# Patient Record
Sex: Female | Born: 1965 | Race: Black or African American | Hispanic: No | Smoking: Never smoker
Health system: Southern US, Community
[De-identification: ages and names within clinical notes are randomized; demographics above are authoritative.]

## PROBLEM LIST (undated history)

## (undated) DIAGNOSIS — Z8742 Personal history of other diseases of the female genital tract: Secondary | ICD-10-CM

## (undated) DIAGNOSIS — B379 Candidiasis, unspecified: Secondary | ICD-10-CM

## (undated) DIAGNOSIS — A499 Bacterial infection, unspecified: Secondary | ICD-10-CM

## (undated) DIAGNOSIS — Z8619 Personal history of other infectious and parasitic diseases: Secondary | ICD-10-CM

## (undated) DIAGNOSIS — R6882 Decreased libido: Secondary | ICD-10-CM

## (undated) DIAGNOSIS — K219 Gastro-esophageal reflux disease without esophagitis: Secondary | ICD-10-CM

## (undated) HISTORY — DX: Bacterial infection, unspecified: A49.9

## (undated) HISTORY — DX: Decreased libido: R68.82

## (undated) HISTORY — DX: Gastro-esophageal reflux disease without esophagitis: K21.9

## (undated) HISTORY — DX: Personal history of other infectious and parasitic diseases: Z86.19

## (undated) HISTORY — DX: Personal history of other diseases of the female genital tract: Z87.42

## (undated) HISTORY — DX: Candidiasis, unspecified: B37.9

---

## 2002-12-12 ENCOUNTER — Inpatient Hospital Stay (HOSPITAL_COMMUNITY): Admission: AD | Admit: 2002-12-12 | Discharge: 2002-12-12 | Payer: Self-pay | Admitting: *Deleted

## 2010-07-11 DIAGNOSIS — R6882 Decreased libido: Secondary | ICD-10-CM

## 2010-07-11 HISTORY — DX: Decreased libido: R68.82

## 2010-07-21 DIAGNOSIS — Z8742 Personal history of other diseases of the female genital tract: Secondary | ICD-10-CM

## 2010-07-21 HISTORY — DX: Personal history of other diseases of the female genital tract: Z87.42

## 2011-04-16 ENCOUNTER — Ambulatory Visit (INDEPENDENT_AMBULATORY_CARE_PROVIDER_SITE_OTHER): Payer: BC Managed Care – PPO | Admitting: Obstetrics and Gynecology

## 2011-04-16 ENCOUNTER — Encounter: Payer: Self-pay | Admitting: Obstetrics and Gynecology

## 2011-04-16 VITALS — BP 122/80 | Temp 99.0°F | Wt 157.0 lb

## 2011-04-16 DIAGNOSIS — N951 Menopausal and female climacteric states: Secondary | ICD-10-CM

## 2011-04-16 DIAGNOSIS — N760 Acute vaginitis: Secondary | ICD-10-CM

## 2011-04-16 DIAGNOSIS — N898 Other specified noninflammatory disorders of vagina: Secondary | ICD-10-CM

## 2011-04-16 DIAGNOSIS — A499 Bacterial infection, unspecified: Secondary | ICD-10-CM

## 2011-04-16 DIAGNOSIS — B9689 Other specified bacterial agents as the cause of diseases classified elsewhere: Secondary | ICD-10-CM

## 2011-04-16 MED ORDER — METRONIDAZOLE 500 MG PO TABS: 500.0000 mg | ORAL_TABLET | Freq: Two times a day (BID) | ORAL | Status: AC

## 2011-04-16 NOTE — Progress Notes (Signed)
Pt. complains of malodorous vaginal discharge for 1 week. Also stopped BCPs  due to Bp increase but wants something for vasomotor symptoms. Reviewed herbal &  hormonal options.  Patient to consider.  States that she had her thyroid checked 2 months ago and results were normal.  O: EGBUS:  wnl, vagina  normal, cervix-no tenderness or lesions, uterus without tenderness and normal size, adnexae without tenderness or masses   Wet prep  pH 5.0  + whiff  + clue  A:Bacterial Vaginosis     Vasomotor Symptoms    Decreased Libido  P: Metronidazole 500 mg bid x 7 days; Etoh precautions     given  Reviewed causes of decreased libido.  Brochure given on Estrovera (rhubarb root) outlining study comparison with estrogen for vasomotor symptoms   Patient to discuss vasomotor symptoms and decreased libido with Dr. Pennie Rushing at annual exam in July.

## 2011-05-17 ENCOUNTER — Telehealth: Payer: Self-pay | Admitting: Obstetrics and Gynecology

## 2011-05-17 NOTE — Telephone Encounter (Signed)
Tc from pt. Pt c/o vaginal discharge with odor. No irritation. Appt sched 05-18-11@2 :45p with ep for eval. Pt voices understanding.

## 2011-05-17 NOTE — Telephone Encounter (Signed)
chandra/epic 

## 2011-05-18 ENCOUNTER — Ambulatory Visit (INDEPENDENT_AMBULATORY_CARE_PROVIDER_SITE_OTHER): Payer: BC Managed Care – PPO | Admitting: Obstetrics and Gynecology

## 2011-05-18 ENCOUNTER — Encounter: Payer: Self-pay | Admitting: Obstetrics and Gynecology

## 2011-05-18 VITALS — BP 122/80 | HR 82 | Ht 63.0 in | Wt 156.0 lb

## 2011-05-18 DIAGNOSIS — B373 Candidiasis of vulva and vagina: Secondary | ICD-10-CM

## 2011-05-18 DIAGNOSIS — N898 Other specified noninflammatory disorders of vagina: Secondary | ICD-10-CM

## 2011-05-18 LAB — POCT WET PREP (WET MOUNT): Clue Cells Wet Prep Whiff POC: NEGATIVE

## 2011-05-18 MED ORDER — FLUCONAZOLE 150 MG PO TABS: 150.0000 mg | ORAL_TABLET | Freq: Once | ORAL | Status: AC

## 2011-05-18 MED ORDER — METRONIDAZOLE 0.75 % VA GEL: 1.0000 | Freq: Two times a day (BID) | VAGINAL | Status: AC

## 2011-05-18 NOTE — Progress Notes (Signed)
45 YO seen last month for BV returns with symptoms of the same.    O: Pelvic: EGBUS-wnl, vagina-normal rugae- thin white d/c, cervix-no lesions, uterus-normal size, adnexae-no masses  Wet Prep: pH 4.5, whiff-negative, yeast   A; Candida Vaginitis   P: Boric Acid Capsules 600 mg #30 1 pv twice weekly x 4 weeks then prn rf x 1 year  Diflucan 150 mg 1 po stat 1refill  Patient going on a trip to [place] in 2 days

## 2011-05-18 NOTE — Patient Instructions (Signed)
[place] pharmacies that carry Boric Acid Capsules/Suppositories:  [facility] [address] (only)  [phone]  [facility]  [address] ([facility])  [phone]  [facility] ([place])  [address] [phone]  [facility]  [address] [phone]     

## 2011-05-18 NOTE — Progress Notes (Signed)
Odor: yes Fever: no Pelvic Pain: no  Itching: yes Dyspareunia: no Desires GC/CT: no  Thin: yes History of PID: no Desires HIV,RPR,HbsAG: no  Thick: no History of STD: no Other: c/o thin watery d/c

## 2011-05-19 ENCOUNTER — Other Ambulatory Visit: Payer: Self-pay | Admitting: Obstetrics and Gynecology

## 2011-05-19 NOTE — Telephone Encounter (Signed)
Spoke with pharmacist at [facility] rgd instruction for metrogel advised one applicator full at [facility] for five days

## 2011-05-19 NOTE — Telephone Encounter (Signed)
Triage/epic 

## 2011-08-18 ENCOUNTER — Encounter: Payer: BC Managed Care – PPO | Admitting: Obstetrics and Gynecology

## 2011-09-08 ENCOUNTER — Ambulatory Visit: Payer: BC Managed Care – PPO | Admitting: Obstetrics and Gynecology

## 2012-01-31 ENCOUNTER — Encounter: Payer: Self-pay | Admitting: Obstetrics and Gynecology

## 2012-01-31 ENCOUNTER — Ambulatory Visit: Payer: BC Managed Care – PPO | Admitting: Obstetrics and Gynecology

## 2012-01-31 VITALS — BP 120/78 | Temp 98.7°F | Wt 160.0 lb

## 2012-01-31 DIAGNOSIS — N390 Urinary tract infection, site not specified: Secondary | ICD-10-CM

## 2012-01-31 DIAGNOSIS — R309 Painful micturition, unspecified: Secondary | ICD-10-CM

## 2012-01-31 DIAGNOSIS — N912 Amenorrhea, unspecified: Secondary | ICD-10-CM

## 2012-01-31 LAB — POCT URINALYSIS DIPSTICK
Bilirubin, UA: NEGATIVE
Glucose, UA: NEGATIVE
Ketones, UA: NEGATIVE
Spec Grav, UA: <=1.005
pH, UA: 7

## 2012-01-31 MED ORDER — FLUCONAZOLE 150 MG PO TABS: 150.0000 mg | ORAL_TABLET | Freq: Once | ORAL | Status: DC

## 2012-01-31 MED ORDER — CIPROFLOXACIN HCL 500 MG PO TABS: 500.0000 mg | ORAL_TABLET | Freq: Two times a day (BID) | ORAL | Status: DC

## 2012-01-31 NOTE — Progress Notes (Signed)
47 YO with skipped period in January and has not had any molimina and noticed after period in December, had hot flashes that lasted about 3 weeks and request for uti evaluation due to dysuria, malodorous urine and "bladder" crampiness. 1 week ago.  O: U/A- SG: 1.005, pH-7,  1+ leuk/blood      Abdomen: soft without tenderness or guarding      Pelvic: EGBUS-wnl, vagina-normal, cervix/uterus-without tenderness, adnexae-no masses or tenderness  A: Oligomenorrhea     UTI  P: Cipro 500 mg  # 6 bid x 3 days     Diflucan 150 mg #1 1 po stat 1 refill prn      Reviewed menopause definition and possible comfort measures (medical, herbal & misc.)     Patient to observe for now, may try to locate [employer] for comfort      Planning trip to [place] in spring may consider something if needed for vasomotor symptoms      Keep menstrual calendar      Henreitta Leber, PA-C

## 2012-01-31 NOTE — Patient Instructions (Signed)
[employer]  (may be available)  [facility]: [employer] [facility] [employer]

## 2012-02-01 ENCOUNTER — Ambulatory Visit (INDEPENDENT_AMBULATORY_CARE_PROVIDER_SITE_OTHER): Payer: BC Managed Care – PPO | Admitting: Family Medicine

## 2012-02-01 VITALS — BP 128/82 | HR 91 | Temp 99.0°F | Resp 17 | Ht 63.5 in | Wt 156.0 lb

## 2012-02-01 DIAGNOSIS — T148XXA Other injury of unspecified body region, initial encounter: Secondary | ICD-10-CM

## 2012-02-01 DIAGNOSIS — M79609 Pain in unspecified limb: Secondary | ICD-10-CM

## 2012-02-01 DIAGNOSIS — Z23 Encounter for immunization: Secondary | ICD-10-CM

## 2012-02-01 MED ORDER — HYDROCODONE-ACETAMINOPHEN 5-325 MG PO TABS: 1.0000 | ORAL_TABLET | Freq: Four times a day (QID) | ORAL | Status: DC | PRN

## 2012-02-01 NOTE — Progress Notes (Signed)
Patient ID: LAFAYE MCELMURRY MRN: [id number], DOB: [birth year], 47 y.o. Date of Encounter: 02/01/2012, 1:36 PM   PROCEDURE NOTE: Verbal consent obtained. Sterile technique employed. Numbing: Anesthesia obtained with 2% lidocaine plain.  Cleansed with soap and water. Irrigated.  Wound explored, no deep structures involved, no foreign bodies.   Wound repaired with # 3 HM and #2 SI sutures. Hemostasis obtained. Wound cleansed and dressed.  Wound care instructions including precautions covered with patient. Handout given.  Anticipate suture removal in 10 days.   Rhoderick Moody, PA-C 02/01/2012 1:36 PM

## 2012-02-01 NOTE — Progress Notes (Signed)
This 47 year old woman who was injured while cleaning her blender. The blade fell out and lacerated her right dorsal ring finger at the PIP joint. It bled for a while but has stopped at this point. She's unsure of her last technical shot.  Objective: No acute distress Examination of the right ring finger reveals full range of motion with good strength on extension. There is approximately 1.2 cm laceration diagonally across the PIP joint dorsally. There is a small superficial diagonal laceration to the extensor tendon.  Sensation is normal. There is no significant swelling and bleeding stopped.    assessment: Simple laceration right dorsal ring finger at the PIP joint  Plan: Tdap and wound repair per PA

## 2012-02-01 NOTE — Patient Instructions (Addendum)

## 2012-02-02 LAB — URINE CULTURE

## 2012-02-11 ENCOUNTER — Ambulatory Visit (INDEPENDENT_AMBULATORY_CARE_PROVIDER_SITE_OTHER): Payer: BC Managed Care – PPO | Admitting: Family Medicine

## 2012-02-11 VITALS — BP 129/84 | HR 87 | Temp 98.3°F | Resp 16 | Ht 63.5 in | Wt 159.0 lb

## 2012-02-11 DIAGNOSIS — Z4802 Encounter for removal of sutures: Secondary | ICD-10-CM

## 2012-02-11 NOTE — Progress Notes (Signed)
Subjective: Patient sutures 9 days ago. She is here for suture removal  Objective wound on finger appears well-healed. Sutures removed without difficulty.  Assessment: Wound finger, resolving:  Plan:  Return when necessary. Keep a Band-Aid for a few days.

## 2013-06-19 DIAGNOSIS — Z975 Presence of (intrauterine) contraceptive device: Secondary | ICD-10-CM | POA: Insufficient documentation

## 2015-09-10 DIAGNOSIS — B009 Herpesviral infection, unspecified: Secondary | ICD-10-CM | POA: Insufficient documentation

## 2015-10-24 DIAGNOSIS — R09A2 Foreign body sensation, throat: Secondary | ICD-10-CM | POA: Insufficient documentation

## 2015-10-24 DIAGNOSIS — K219 Gastro-esophageal reflux disease without esophagitis: Secondary | ICD-10-CM | POA: Insufficient documentation

## 2015-10-24 DIAGNOSIS — R0989 Other specified symptoms and signs involving the circulatory and respiratory systems: Secondary | ICD-10-CM | POA: Insufficient documentation

## 2015-10-29 ENCOUNTER — Other Ambulatory Visit (HOSPITAL_COMMUNITY): Payer: Self-pay | Admitting: Otolaryngology

## 2015-10-29 DIAGNOSIS — R1310 Dysphagia, unspecified: Secondary | ICD-10-CM

## 2015-11-12 ENCOUNTER — Ambulatory Visit (HOSPITAL_COMMUNITY)
Admission: RE | Admit: 2015-11-12 | Discharge: 2015-11-12 | Disposition: A | Discharge status: Home / Self Care | Payer: 59 | Source: Ambulatory Visit | Attending: Otolaryngology | Admitting: Otolaryngology

## 2015-11-12 DIAGNOSIS — R1310 Dysphagia, unspecified: Secondary | ICD-10-CM

## 2018-03-16 ENCOUNTER — Encounter: Payer: Self-pay | Admitting: Family Medicine

## 2018-03-16 ENCOUNTER — Ambulatory Visit (INDEPENDENT_AMBULATORY_CARE_PROVIDER_SITE_OTHER): Payer: Managed Care, Other (non HMO) | Admitting: Family Medicine

## 2018-03-16 ENCOUNTER — Other Ambulatory Visit: Payer: Self-pay

## 2018-03-16 VITALS — BP 140/90 | HR 97 | Temp 98.3°F | Ht 63.0 in | Wt 160.2 lb

## 2018-03-16 DIAGNOSIS — Z1211 Encounter for screening for malignant neoplasm of colon: Secondary | ICD-10-CM

## 2018-03-16 DIAGNOSIS — Z0000 Encounter for general adult medical examination without abnormal findings: Secondary | ICD-10-CM | POA: Diagnosis not present

## 2018-03-16 DIAGNOSIS — K219 Gastro-esophageal reflux disease without esophagitis: Secondary | ICD-10-CM | POA: Diagnosis not present

## 2018-03-16 NOTE — Progress Notes (Signed)
Stephanie Cherry is a 53 y.o. female  Chief Complaint  Patient presents with  . Establish Care    establish care/Chronic cough years-- may be connected to acid reflux? / scheduling colonoscopy, Jan 2020 ap, oct 2019 mammo    HPI: Stephanie Cherry is a 53 y.o. female here to establish care with our office.  She is also here for annual CPE. She is not fasting for labs and will RTO for fasting lab appt.  Her biggest issue/concern is cough, globus sensation, throat clearing. Symptoms worse after eating, at night, and first thing in AM. She has seen ENT Dr. Wilburn Cornelia and was Rx'd omeprazole 59m daily. She had barium swallow about 2 years that was normal. She was referred for colonoscopy to GI Dr. MCollene Maresbut pt would like referral to be seen in office to discuss reflux  Last PAP: 01/2018 Last mammo: 10/2017 Last colonoscopy: never   Past Medical History:  Diagnosis Date  . Bacterial infection   . H/O chlamydia infection   . H/O: menorrhagia 07/21/10  . Libido, decreased 07/11/2010  . Yeast infection     No past surgical history on file.  Social History   Socioeconomic History  . Marital status: Divorced    Spouse name: Not on file  . Number of children: Not on file  . Years of education: Not on file  . Highest education level: Not on file  Occupational History  . Not on file  Social Needs  . Financial resource strain: Not on file  . Food insecurity:    Worry: Not on file    Inability: Not on file  . Transportation needs:    Medical: Not on file    Non-medical: Not on file  Tobacco Use  . Smoking status: Never Smoker  . Smokeless tobacco: Never Used  Substance and Sexual Activity  . Alcohol use: Yes    Comment: social  . Drug use: No  . Sexual activity: Yes    Birth control/protection: Condom  Lifestyle  . Physical activity:    Days per week: Not on file    Minutes per session: Not on file  . Stress: Not on file  Relationships  . Social connections:    Talks on  phone: Not on file    Gets together: Not on file    Attends religious service: Not on file    Active member of club or organization: Not on file    Attends meetings of clubs or organizations: Not on file    Relationship status: Not on file  . Intimate partner violence:    Fear of current or ex partner: Not on file    Emotionally abused: Not on file    Physically abused: Not on file    Forced sexual activity: Not on file  Other Topics Concern  . Not on file  Social History Narrative  . Not on file    Family History  Problem Relation Age of Onset  . Hypertension Father   . Hypertension Mother      Immunization History  Administered Date(s) Administered  . Influenza Inj Mdck Quad Pf 09/29/2017  . MMR 05/31/2007  . Td 05/31/2007  . Tdap 05/31/2007    Outpatient Encounter Medications as of 03/16/2018  Medication Sig  . Cholecalciferol (VITAMIN D-1000 MAX ST) 25 MCG (1000 UT) tablet Take by mouth.  . estradiol (VIVELLE-DOT) 0.05 MG/24HR patch APPLY 1 PATCH TWICE WEEKLY AS DIRECTED  . omeprazole (PRILOSEC) 40 MG capsule Take  by mouth.  . [DISCONTINUED] Ascorbic Acid (VITAMIN C) 100 MG tablet Take 100 mg by mouth daily.  . [DISCONTINUED] ciprofloxacin (CIPRO) 500 MG tablet Take 1 tablet (500 mg total) by mouth 2 (two) times daily.  . [DISCONTINUED] Evening Primrose topical oil Apply topically as needed.  . [DISCONTINUED] fish oil-omega-3 fatty acids 1000 MG capsule Take 2 g by mouth daily.   . [DISCONTINUED] fluconazole (DIFLUCAN) 150 MG tablet Take 1 tablet (150 mg total) by mouth once.  . [DISCONTINUED] HYDROcodone-acetaminophen (NORCO/VICODIN) 5-325 MG per tablet Take 1 tablet by mouth every 6 (six) hours as needed for pain.  . [DISCONTINUED] Multiple Vitamin (MULTIVITAMIN) tablet Take 1 tablet by mouth daily.   No facility-administered encounter medications on file as of 03/16/2018.      ROS: Gen: no fever, chills  Skin: no rash, itching ENT: no ear pain, ear drainage,  nasal congestion, rhinorrhea, sinus pressure, sore throat Eyes: no blurry vision, double vision Resp: no cough, wheeze,SOB CV: no CP, palpitations, LE edema,  GI: + heartburn, n/v/d/c, abd pain GU: no dysuria, urgency, frequency, hematuria  MSK: no joint pain, myalgias, back pain Neuro: no dizziness, headache, weakness, vertigo Psych: no depression, anxiety, insomnia   Allergies  Allergen Reactions  . Sulfa Antibiotics Hives    BP 140/90   Pulse 97   Temp 98.3 F (36.8 C) (Oral)   Ht 5' 3" (1.6 m)   Wt 160 lb 3.2 oz (72.7 kg)   SpO2 98%   BMI 28.38 kg/m   BP Readings from Last 3 Encounters:  03/16/18 140/90  02/11/12 129/84  02/01/12 128/82     Physical Exam  Constitutional: She is oriented to person, place, and time. She appears well-developed and well-nourished. No distress.  HENT:  Head: Normocephalic and atraumatic.  Right Ear: Tympanic membrane and ear canal normal.  Left Ear: Tympanic membrane and ear canal normal.  Nose: Nose normal.  Mouth/Throat: Oropharynx is clear and moist and mucous membranes are normal.  Neck: Neck supple. No thyromegaly present.  Cardiovascular: Normal rate, regular rhythm and normal heart sounds.  Pulmonary/Chest: Effort normal and breath sounds normal. No respiratory distress. She has no wheezes. She has no rhonchi.  Abdominal: Soft. Bowel sounds are normal. She exhibits no distension and no mass. There is no abdominal tenderness.  Musculoskeletal: Normal range of motion.        General: No edema.  Lymphadenopathy:    She has no cervical adenopathy.  Neurological: She is alert and oriented to person, place, and time.  Skin: Skin is warm and dry.  Psychiatric: She has a normal mood and affect. Her behavior is normal.     A/P:  1. Screening for colon cancer - Ambulatory referral to Gastroenterology  2. Gastroesophageal reflux disease, esophagitis presence not specified - symptoms x years - minimal relief with omeprazole 90m,  diet modification - normal barium swallow 2 years ago (ordered by ENT) - Ambulatory referral to Gastroenterology  3. Annual physical exam - UTD on dental exam, pt plans to schedule vision exam - discussed importance of regular CV exercise, healthy diet, adequate sleep - UTD on PAP, mammo; referral for colonoscopy placed - ALT - AST - Basic metabolic panel - VITAMIN D 25 Hydroxy (Vit-D Deficiency, Fractures) - Lipid panel - Vitamin B12 - next CPE in 1 year

## 2018-03-17 ENCOUNTER — Other Ambulatory Visit: Payer: Self-pay

## 2018-03-17 ENCOUNTER — Other Ambulatory Visit (INDEPENDENT_AMBULATORY_CARE_PROVIDER_SITE_OTHER): Payer: Managed Care, Other (non HMO)

## 2018-03-17 DIAGNOSIS — Z01419 Encounter for gynecological examination (general) (routine) without abnormal findings: Secondary | ICD-10-CM

## 2018-03-17 LAB — BASIC METABOLIC PANEL
BUN: 15 mg/dL (ref 6–23)
CHLORIDE: 102 meq/L (ref 96–112)
CO2: 31 mEq/L (ref 19–32)
Calcium: 9.6 mg/dL (ref 8.4–10.5)
Creatinine, Ser: 0.91 mg/dL (ref 0.40–1.20)
GFR: 78.3 mL/min (ref >60.00)
Glucose, Bld: 90 mg/dL (ref 70–99)
POTASSIUM: 4.3 meq/L (ref 3.5–5.1)
Sodium: 141 mEq/L (ref 135–145)

## 2018-03-17 LAB — LIPID PANEL
Cholesterol: 217 mg/dL — ABNORMAL HIGH (ref 0–200)
HDL: 69.6 mg/dL (ref >39.00)
LDL Cholesterol: 130 mg/dL — ABNORMAL HIGH (ref 0–99)
NonHDL: 147.17
TRIGLYCERIDES: 86 mg/dL (ref 0.0–149.0)
Total CHOL/HDL Ratio: 3
VLDL: 17.2 mg/dL (ref 0.0–40.0)

## 2018-03-17 LAB — VITAMIN D 25 HYDROXY (VIT D DEFICIENCY, FRACTURES): VITD: 30.22 ng/mL (ref 30.00–100.00)

## 2018-03-17 LAB — AST: AST: 19 U/L (ref 0–37)

## 2018-03-17 LAB — ALT: ALT: 13 U/L (ref 0–35)

## 2018-03-17 LAB — VITAMIN B12: Vitamin B-12: 805 pg/mL (ref 211–911)

## 2018-03-17 NOTE — Progress Notes (Signed)
Labs drawn from 3.12.20 visit with [facility]/thx dmf

## 2018-03-27 HISTORY — PX: COLONOSCOPY: SHX174

## 2018-03-27 HISTORY — PX: ESOPHAGOGASTRODUODENOSCOPY: SHX1529

## 2018-06-19 ENCOUNTER — Encounter: Payer: Self-pay | Admitting: Family Medicine

## 2019-03-22 ENCOUNTER — Encounter: Payer: Self-pay | Admitting: Family Medicine

## 2019-03-22 ENCOUNTER — Telehealth (INDEPENDENT_AMBULATORY_CARE_PROVIDER_SITE_OTHER): Payer: BLUE CROSS/BLUE SHIELD | Admitting: Family Medicine

## 2019-03-22 ENCOUNTER — Encounter: Payer: Self-pay | Admitting: Gastroenterology

## 2019-03-22 VITALS — Ht 63.0 in

## 2019-03-22 DIAGNOSIS — R5383 Other fatigue: Secondary | ICD-10-CM | POA: Diagnosis not present

## 2019-03-22 DIAGNOSIS — Z0000 Encounter for general adult medical examination without abnormal findings: Secondary | ICD-10-CM

## 2019-03-22 DIAGNOSIS — K219 Gastro-esophageal reflux disease without esophagitis: Secondary | ICD-10-CM | POA: Diagnosis not present

## 2019-03-22 DIAGNOSIS — R1030 Lower abdominal pain, unspecified: Secondary | ICD-10-CM

## 2019-03-22 NOTE — Progress Notes (Signed)
Virtual Visit via Video Note  I connected with SHARNEE Cherry on 03/22/19 at 11:30 AM EDT by a video enabled telemedicine application and verified that I am speaking with the correct person using two identifiers. Location patient: home Location provider: home office Persons participating in the virtual visit: patient, provider  I discussed the limitations of evaluation and management by telemedicine and the availability of in person appointments. The patient expressed understanding and agreed to proceed.  Chief Complaint  Patient presents with  . Allergies    Pt c/o sypmtoms of a gluten allergy.  Pt said that after she eats, ther is abd pain and diarrhea.  Pt explains that this happens intermittently but more frequent x 39months or so.  Pt has concerns of a sensitvity to gluten    HPI: Stephanie Cherry is a 54 y.o. female who complains of episodes of abdominal pain and diarrhea after eating. She notes 5 episodes in the past 6 mo. She cannot relate these episodes to what she ate. Otherwise she has 1 BM daily in AM. No blood in stool. No unexplained weight loss. No fam h/o IBD, celiac. She wonders/is worries about a gluten allergy.  She complains of globus sensation, bloating, flatus and belching - on/off x years - more frequent in the past 6 mo.  She notes a decreased appetite which is not new and mainly because she "knows what is to come".  She colonoscopy and endoscopy done 1 year ago with Dr. Collene Mares.  She has LPR, has been on/off omeprazole. She follows with ENT Dr[facility] Jerrell [facility]) - records available in [place].  She is on omeprazole 40mg  BID since 10/2018 but is now "cycling off". She does not want to be on meds long term, especially if she does not feel they are helping.   She is due for CPE, labs. She would like to come in tomorrow AM for fasting lab appt.   Past Medical History:  Diagnosis Date  . Bacterial infection   . H/O chlamydia infection   . H/O:  menorrhagia 07/21/10  . Libido, decreased 07/11/2010  . Yeast infection     History reviewed. No pertinent surgical history.  Family History  Problem Relation Age of Onset  . Hypertension Father   . Arthritis Father   . Hypertension Mother   . Arthritis Mother   . Cancer Mother   . Hyperlipidemia Mother   . Arthritis Brother   . Hyperlipidemia Brother   . Hypertension Brother   . Arthritis Maternal Grandmother   . Cancer Maternal Grandfather     Social History   Tobacco Use  . Smoking status: Never Smoker  . Smokeless tobacco: Never Used  Substance Use Topics  . Alcohol use: Yes    Comment: social  . Drug use: No     Current Outpatient Medications:  .  Cholecalciferol (VITAMIN D-1000 MAX ST) 25 MCG (1000 UT) tablet, Take by mouth., Disp: , Rfl:  .  estradiol (VIVELLE-DOT) 0.05 MG/24HR patch, APPLY 1 PATCH TWICE WEEKLY AS DIRECTED, Disp: , Rfl:  .  fluticasone (FLONASE) 50 MCG/ACT nasal spray, Place into the nose., Disp: , Rfl:  .  HYDROcodone-acetaminophen (NORCO/VICODIN) 5-325 MG tablet, hydrocodone 5 mg-acetaminophen 325 mg tablet, Disp: , Rfl:  .  ipratropium (ATROVENT) 0.06 % nasal spray, Place 2 sprays into both nostrils 2 (two) times daily., Disp: , Rfl:  .  levonorgestrel (MIRENA, 52 MG,) 20 MCG/24HR IUD, Mirena 20 mcg/24 hours (6 yrs) 52 mg intrauterine  device, Disp: , Rfl:  .  omeprazole (PRILOSEC) 40 MG capsule, omeprazole 40 mg capsule,delayed release, Disp: , Rfl:  .  omeprazole (PRILOSEC) 40 MG capsule, Take by mouth., Disp: , Rfl:   Allergies  Allergen Reactions  . Sulfa Antibiotics Hives      ROS: See pertinent positives and negatives per HPI.   EXAM:  VITALS per patient if applicable: Ht 5\' 3"  (1.6 m)   BMI 28.38 kg/m    GENERAL: alert, oriented, appears well and in no acute distress  HEENT: atraumatic, conjunctiva clear, no obvious abnormalities on inspection of external nose and ears  NECK: normal movements of the head and neck  LUNGS:  on inspection no signs of respiratory distress, breathing rate appears normal, no obvious gross SOB, gasping or wheezing, no conversational dyspnea  CV: no obvious cyanosis  PSYCH/NEURO: pleasant and cooperative, speech and thought processing grossly intact   ASSESSMENT AND PLAN:  1. Laryngopharyngeal reflux (LPR) 2. Gastroesophageal reflux disease, unspecified whether esophagitis present - symptoms x years - minimal improvement with PPIs, stil with daily symptoms - endoscopy done with Dr. Collene Mares in 03/2018 - pt would be interested in seeing [facility] Dr. Gerrit Heck to discuss symptoms and TIF procedure - Ambulatory referral to Gastroenterology  3. Annual physical exam - pt to schedule - CBC; Future - VITAMIN D 25 Hydroxy (Vit-D Deficiency, Fractures); Future - Vitamin B12; Future - Lipid panel; Future - Basic metabolic panel; Future - AST; Future - ALT; Future  4. Intermittent lower abdominal pain - Ambulatory referral to Gastroenterology  5. Fatigue, unspecified type - CBC; Future - Vitamin B12; Future   I discussed the assessment and treatment plan with the patient. The patient was provided an opportunity to ask questions and all were answered. The patient agreed with the plan and demonstrated an understanding of the instructions.   The patient was advised to call back or seek an in-person evaluation if the symptoms worsen or if the condition fails to improve as anticipated.   [name], DO

## 2019-03-23 ENCOUNTER — Other Ambulatory Visit (INDEPENDENT_AMBULATORY_CARE_PROVIDER_SITE_OTHER): Payer: BLUE CROSS/BLUE SHIELD

## 2019-03-23 ENCOUNTER — Other Ambulatory Visit: Payer: Self-pay

## 2019-03-23 DIAGNOSIS — R5383 Other fatigue: Secondary | ICD-10-CM

## 2019-03-23 DIAGNOSIS — Z0000 Encounter for general adult medical examination without abnormal findings: Secondary | ICD-10-CM | POA: Diagnosis not present

## 2019-03-23 LAB — LIPID PANEL
Cholesterol: 197 mg/dL (ref 0–200)
HDL: 57.3 mg/dL (ref >39.00)
LDL Cholesterol: 127 mg/dL — ABNORMAL HIGH (ref 0–99)
NonHDL: 139.23
Total CHOL/HDL Ratio: 3
Triglycerides: 62 mg/dL (ref 0.0–149.0)
VLDL: 12.4 mg/dL (ref 0.0–40.0)

## 2019-03-23 LAB — AST: AST: 18 U/L (ref 0–37)

## 2019-03-23 LAB — CBC
HCT: 42.4 % (ref 36.0–46.0)
Hemoglobin: 14.6 g/dL (ref 12.0–15.0)
MCHC: 34.4 g/dL (ref 30.0–36.0)
MCV: 100.6 fl — ABNORMAL HIGH (ref 78.0–100.0)
Platelets: 275 10*3/uL (ref 150.0–400.0)
RBC: 4.21 Mil/uL (ref 3.87–5.11)
RDW: 12.1 % (ref 11.5–15.5)
WBC: 5.5 10*3/uL (ref 4.0–10.5)

## 2019-03-23 LAB — VITAMIN D 25 HYDROXY (VIT D DEFICIENCY, FRACTURES): VITD: 36.91 ng/mL (ref 30.00–100.00)

## 2019-03-23 LAB — BASIC METABOLIC PANEL
BUN: 12 mg/dL (ref 6–23)
CO2: 32 mEq/L (ref 19–32)
Calcium: 9.7 mg/dL (ref 8.4–10.5)
Chloride: 102 mEq/L (ref 96–112)
Creatinine, Ser: 1.1 mg/dL (ref 0.40–1.20)
GFR: 62.67 mL/min (ref >60.00)
Glucose, Bld: 91 mg/dL (ref 70–99)
Potassium: 4.1 mEq/L (ref 3.5–5.1)
Sodium: 141 mEq/L (ref 135–145)

## 2019-03-23 LAB — VITAMIN B12: Vitamin B-12: 1065 pg/mL — ABNORMAL HIGH (ref 211–911)

## 2019-03-23 LAB — ALT: ALT: 14 U/L (ref 0–35)

## 2019-03-29 ENCOUNTER — Encounter: Payer: Self-pay | Admitting: Family Medicine

## 2019-04-24 ENCOUNTER — Ambulatory Visit: Payer: BLUE CROSS/BLUE SHIELD | Admitting: Gastroenterology

## 2019-04-24 ENCOUNTER — Encounter: Payer: Self-pay | Admitting: Gastroenterology

## 2019-04-24 ENCOUNTER — Other Ambulatory Visit: Payer: Self-pay

## 2019-04-24 VITALS — BP 132/86 | HR 96 | Temp 97.5°F | Ht 63.0 in | Wt 157.5 lb

## 2019-04-24 DIAGNOSIS — K219 Gastro-esophageal reflux disease without esophagitis: Secondary | ICD-10-CM

## 2019-04-24 DIAGNOSIS — R198 Other specified symptoms and signs involving the digestive system and abdomen: Secondary | ICD-10-CM

## 2019-04-24 DIAGNOSIS — R05 Cough: Secondary | ICD-10-CM | POA: Diagnosis not present

## 2019-04-24 DIAGNOSIS — R490 Dysphonia: Secondary | ICD-10-CM

## 2019-04-24 DIAGNOSIS — R0989 Other specified symptoms and signs involving the circulatory and respiratory systems: Secondary | ICD-10-CM

## 2019-04-24 DIAGNOSIS — R053 Chronic cough: Secondary | ICD-10-CM

## 2019-04-24 NOTE — Progress Notes (Signed)
Chief Complaint: GERD, LPR, chronic cough  Referring Provider:     Ronnald Nian, DO   HPI:    Stephanie Cherry is a 54 y.o. female referred to the Gastroenterology Clinic for evaluation of reflux symptoms, chronic cough, LPR.  Index reflux sxs of chronic cough, increased throat clearing, raspy voice, hoarseness, globus sensation. Sxs can occur throughout the day, but worse at night, with choking sensation. Not related to PO intake or food types. Sleeps with HOB elevated. No dysphagia, odynophagia. Avoids eating within 3 hours of bedtime. Sxs present since at least 2013, and progressively worsening. No HB or regurgitation.   Separately, c/o globus sensation, abdominal bloating, increased flatus and belching.  Symptoms have been intermittent for a number of years, but more frequent over the last 6 months or so.  Colonoscopy and EGD laryngopharyngeal completed by Dr. Collene Mares 03/2018 as below.  Previously diagnosed with LPR, has been on/off omeprazole.  Follows with [facility].  Last seen in 10/2018, and per notes, history and exam most consistent with LPR with plan for 89-month trial of omeprazole 40 mg  BID daily x4 months.  Only minimal improvement, and has titrated off approx 3 weeks ago.   Evaluated in the Allergy Clinic in 10/2018, and I again told she had reflux induced symptoms.  She is interested in antireflux surgical options as a means to better control her symptoms and potentially stop her significant reduce the need for acid suppression therapy.  -Barium swallow (11/2015): Normal -EGD (03/2018, Dr. Collene Mares): We will request full report.  Patient currently reviewed today and seemingly normal -Laryngoscopy (10/2018, Dr. Constance Holster): Moderate post glottic edema, otherwise normal.  Findings consistent with LPR.  -Colonoscopy (3/20, Dr. Collene Mares): We will request.  Patient copy reviewed and images otherwise.  Patient recalls 10-year recall   Past Medical History:  Diagnosis  Date  . Bacterial infection   . GERD (gastroesophageal reflux disease)   . H/O chlamydia infection   . H/O: menorrhagia 07/21/10  . Libido, decreased 07/11/2010  . Yeast infection      Past Surgical History:  Procedure Laterality Date  . COLONOSCOPY    . ESOPHAGOGASTRODUODENOSCOPY     Family History  Problem Relation Age of Onset  . Hypertension Father   . Arthritis Father   . Hypertension Mother   . Arthritis Mother   . Hyperlipidemia Mother   . Lung cancer Mother   . Arthritis Brother   . Hyperlipidemia Brother   . Hypertension Brother   . Arthritis Maternal Grandmother   . Cancer Maternal Grandfather   . Colon cancer Neg Hx   . Esophageal cancer Neg Hx    Social History   Tobacco Use  . Smoking status: Never Smoker  . Smokeless tobacco: Never Used  Substance Use Topics  . Alcohol use: Yes    Comment: social  . Drug use: No   Current Outpatient Medications  Medication Sig Dispense Refill  . estradiol (VIVELLE-DOT) 0.05 MG/24HR patch APPLY 1 PATCH TWICE WEEKLY AS DIRECTED    . levonorgestrel (MIRENA, 52 MG,) 20 MCG/24HR IUD Mirena 20 mcg/24 hours (6 yrs) 52 mg intrauterine device     No current facility-administered medications for this visit.   Allergies  Allergen Reactions  . Sulfa Antibiotics Hives     Review of Systems: All systems reviewed and negative except where noted in HPI.     Physical Exam:    Wt  Readings from Last 3 Encounters:  04/24/19 157 lb 8 oz (71.4 kg)  03/16/18 160 lb 3.2 oz (72.7 kg)  02/11/12 159 lb (72.1 kg)    BP 132/86   Pulse 96   Temp (!) 97.5 F (36.4 C)   Ht 5\' 3"  (1.6 m)   Wt 157 lb 8 oz (71.4 kg)   BMI 27.90 kg/m  Constitutional:  Pleasant, in no acute distress. Psychiatric: Normal mood and affect. Behavior is normal. EENT: Pupils normal.  Conjunctivae are normal. No scleral icterus. Neck supple. No cervical LAD. Cardiovascular: Normal rate, regular rhythm. No edema Pulmonary/chest: Effort normal and breath  sounds normal. No wheezing, rales or rhonchi. Abdominal: Soft, nondistended, nontender. Bowel sounds active throughout. There are no masses palpable. No hepatomegaly. Neurological: Alert and oriented to person place and time. Skin: Skin is warm and dry. No rashes noted.   ASSESSMENT AND PLAN;   1) Chronic cough 2) LPR 3) Globus sensation 4) raspy voice 5) Increased throat clearing  Clinical presentation certainly seems highly suspicious for GERD with LPR.  Suboptimal response to adequate trial of high-dose PPI therapy.  Discussed pathophysiology flex at length today, to include differences of typical vs atypical symptoms, along with medical management and antireflux surgeries, and most specifically TIF, and will proceed as below:  -EGD to evaluate for erosive esophagitis, LES laxity, hiatal hernia -Depending on timing of EGD and availability, if able, can plan for [facility] at that time.  Otherwise, if EGD unrevealing, will set up for pH/impedance testing -Esophagram 2017 was otherwise unremarkable -Resume antireflux lifestyle/dietary modifications as currently employed  The indications, risks, and benefits of EGD were explained to the patient in detail. Risks include but are not limited to bleeding, perforation, adverse reaction to medications, and cardiopulmonary compromise. Sequelae include but are not limited to the possibility of surgery, hositalization, and mortality. The patient verbalized understanding and wished to proceed. All questions answered, referred to scheduler. Further recommendations pending results of the exam.    Dominic Pea Jakerria Kingbird, DO, FACG  04/24/2019, 1:20 PM   Ana Woodroof, Garvin Fila, DO

## 2019-04-24 NOTE — Patient Instructions (Addendum)
If you are age 54 or older, your body mass index should be between 23-30. Your Body mass index is 27.9 kg/m. If this is out of the aforementioned range listed, please consider follow up with your Primary Care Provider.  If you are age 33 or younger, your body mass index should be between 19-25. Your Body mass index is 27.9 kg/m. If this is out of the aformentioned range listed, please consider follow up with your Primary Care Provider.   It has been recommended to you by your physician that you have a(n) EGD completed. Per your request, we did not schedule the procedure(s) today. Please contact our office at [phone] should you decide to have the procedure completed. You will be scheduled for a pre-visit and procedure at that time.   It was a pleasure to see you today!  Vito Cirigliano, D.O.

## 2019-05-02 ENCOUNTER — Telehealth: Payer: Self-pay | Admitting: Gastroenterology

## 2019-05-02 NOTE — Telephone Encounter (Signed)
Pt is requesting if Dr. Bryan Lemma could prescribe some medication to relieve pt's sxs. She stated that sxs are debilitating. Unfortunately, pt cannot undergo egd at this moment due to financial constrains. Pt would like to be treated with medications until she can have procedure. Pls call her.

## 2019-05-02 NOTE — Telephone Encounter (Signed)
LMOM for patient to call back.

## 2019-05-08 ENCOUNTER — Encounter: Payer: Self-pay | Admitting: Gastroenterology

## 2019-05-08 ENCOUNTER — Other Ambulatory Visit: Payer: Self-pay | Admitting: Gastroenterology

## 2019-05-08 DIAGNOSIS — R490 Dysphonia: Secondary | ICD-10-CM

## 2019-05-08 DIAGNOSIS — R09A2 Foreign body sensation, throat: Secondary | ICD-10-CM

## 2019-05-08 DIAGNOSIS — R198 Other specified symptoms and signs involving the digestive system and abdomen: Secondary | ICD-10-CM

## 2019-05-08 DIAGNOSIS — R0989 Other specified symptoms and signs involving the circulatory and respiratory systems: Secondary | ICD-10-CM

## 2019-05-08 DIAGNOSIS — K219 Gastro-esophageal reflux disease without esophagitis: Secondary | ICD-10-CM

## 2019-05-08 DIAGNOSIS — R05 Cough: Secondary | ICD-10-CM

## 2019-05-08 DIAGNOSIS — Z01818 Encounter for other preprocedural examination: Secondary | ICD-10-CM

## 2019-05-08 DIAGNOSIS — R053 Chronic cough: Secondary | ICD-10-CM

## 2019-05-21 ENCOUNTER — Ambulatory Visit (INDEPENDENT_AMBULATORY_CARE_PROVIDER_SITE_OTHER): Payer: BLUE CROSS/BLUE SHIELD

## 2019-05-21 ENCOUNTER — Other Ambulatory Visit: Payer: Self-pay | Admitting: Gastroenterology

## 2019-05-21 DIAGNOSIS — Z1159 Encounter for screening for other viral diseases: Secondary | ICD-10-CM

## 2019-05-22 LAB — SARS CORONAVIRUS 2 (TAT 6-24 HRS): SARS Coronavirus 2: NEGATIVE

## 2019-05-23 ENCOUNTER — Other Ambulatory Visit: Payer: Self-pay

## 2019-05-23 ENCOUNTER — Ambulatory Visit (AMBULATORY_SURGERY_CENTER): Payer: BLUE CROSS/BLUE SHIELD | Admitting: Gastroenterology

## 2019-05-23 ENCOUNTER — Encounter: Payer: Self-pay | Admitting: Gastroenterology

## 2019-05-23 ENCOUNTER — Encounter: Payer: BLUE CROSS/BLUE SHIELD | Admitting: Gastroenterology

## 2019-05-23 VITALS — BP 119/75 | HR 73 | Temp 96.6°F | Resp 13 | Ht 63.0 in | Wt 157.0 lb

## 2019-05-23 DIAGNOSIS — R053 Chronic cough: Secondary | ICD-10-CM

## 2019-05-23 DIAGNOSIS — K2100 Gastro-esophageal reflux disease with esophagitis, without bleeding: Secondary | ICD-10-CM

## 2019-05-23 DIAGNOSIS — K2900 Acute gastritis without bleeding: Secondary | ICD-10-CM

## 2019-05-23 DIAGNOSIS — K2950 Unspecified chronic gastritis without bleeding: Secondary | ICD-10-CM | POA: Diagnosis not present

## 2019-05-23 DIAGNOSIS — K317 Polyp of stomach and duodenum: Secondary | ICD-10-CM

## 2019-05-23 DIAGNOSIS — K2970 Gastritis, unspecified, without bleeding: Secondary | ICD-10-CM

## 2019-05-23 MED ORDER — SODIUM CHLORIDE 0.9 % IV SOLN: 500.0000 mL | Freq: Once | INTRAVENOUS | Status: DC

## 2019-05-23 NOTE — Progress Notes (Signed)
Report to PACU, RN, vss, BBS= Clear.  

## 2019-05-23 NOTE — Progress Notes (Signed)
Temp-AER VS-DT

## 2019-05-23 NOTE — Patient Instructions (Signed)
YOU HAD AN ENDOSCOPIC PROCEDURE TODAY AT THE [facility]:   Refer to the procedure report that was given to you for any specific questions about what was found during the examination.  If the procedure report does not answer your questions, please call your gastroenterologist to clarify.  If you requested that your care partner not be given the details of your procedure findings, then the procedure report has been included in a sealed envelope for you to review at your convenience later.  YOU SHOULD EXPECT: Some feelings of bloating in the abdomen. Passage of more gas than usual.  Walking can help get rid of the air that was put into your GI tract during the procedure and reduce the bloating. If you had a lower endoscopy (such as a colonoscopy or flexible sigmoidoscopy) you may notice spotting of blood in your stool or on the toilet paper. If you underwent a bowel prep for your procedure, you may not have a normal bowel movement for a few days.  Please Note:  You might notice some irritation and congestion in your nose or some drainage.  This is from the oxygen used during your procedure.  There is no need for concern and it should clear up in a day or so.  SYMPTOMS TO REPORT IMMEDIATELY:    Following upper endoscopy (EGD)  Vomiting of blood or coffee ground material  New chest pain or pain under the shoulder blades  Painful or persistently difficult swallowing  New shortness of breath  Fever of 100F or higher  Black, tarry-looking stools  For urgent or emergent issues, a gastroenterologist can be reached at any hour by calling [phone]. Do not use MyChart messaging for urgent concerns.    DIET:  We do recommend a small meal at first, but then you may proceed to your regular diet.  Drink plenty of fluids but you should avoid alcoholic beverages for 24 hours.  ACTIVITY:  You should plan to take it easy for the rest of today and you should NOT DRIVE or use heavy machinery  until tomorrow (because of the sedation medicines used during the test).    FOLLOW UP: Our staff will call the number listed on your records 48-72 hours following your procedure to check on you and address any questions or concerns that you may have regarding the information given to you following your procedure. If we do not reach you, we will leave a message.  We will attempt to reach you two times.  During this call, we will ask if you have developed any symptoms of COVID 19. If you develop any symptoms (ie: fever, flu-like symptoms, shortness of breath, cough etc.) before then, please call [phone].  If you test positive for Covid 19 in the 2 weeks post procedure, please call and report this information to us.    If any biopsies were taken you will be contacted by phone or by letter within the next 1-3 weeks.  Please call us at [phone] if you have not heard about the biopsies in 3 weeks.    SIGNATURES/CONFIDENTIALITY: You and/or your care partner have signed paperwork which will be entered into your electronic medical record.  These signatures attest to the fact that that the information above on your After Visit Summary has been reviewed and is understood.  Full responsibility of the confidentiality of this discharge information lies with you and/or your care-partner.   Resume medications. Information given on Gastritis. 

## 2019-05-23 NOTE — Op Note (Signed)
[facility] Patient Name: Stephanie Cherry Procedure Date: 05/23/2019 3:13 PM MRN: [id number] Endoscopist: Gerrit Heck , MD Age: 54 Referring MD:  Date of Birth: 1965-02-06 Gender: Female Account #: [id number] Procedure:                Upper GI endoscopy Indications:              Abdominal bloating, Chronic cough, Eructation,                            Globus sensation                           Currently off all PPI therapy. Medicines:                Monitored Anesthesia Care Procedure:                Pre-Anesthesia Assessment:                           - Prior to the procedure, a History and Physical                            was performed, and patient medications and                            allergies were reviewed. The patient's tolerance of                            previous anesthesia was also reviewed. The risks                            and benefits of the procedure and the sedation                            options and risks were discussed with the patient.                            All questions were answered, and informed consent                            was obtained. Prior Anticoagulants: The patient has                            taken no previous anticoagulant or antiplatelet                            agents. ASA Grade Assessment: II - A patient with                            mild systemic disease. After reviewing the risks                            and benefits, the patient was deemed in  satisfactory condition to undergo the procedure.                           After obtaining informed consent, the endoscope was                            passed under direct vision. Throughout the                            procedure, the patient's blood pressure, pulse, and                            oxygen saturations were monitored continuously. The                            Endoscope was introduced through the mouth, and                 advanced to the second part of duodenum. The upper                            GI endoscopy was accomplished without difficulty.                            The patient tolerated the procedure well. Scope In: Scope Out: Findings:                 LA Grade A (one or more mucosal breaks less than 5                            mm, not extending between tops of 2 mucosal folds)                            esophagitis with no bleeding was found 39 cm from                            the incisors.                           The gastroesophageal flap valve was visualized                            endoscopically and classified as Hill Grade II                            (fold present, opens with respiration).                           Multiple small sessile polyps with no bleeding and                            no stigmata of recent bleeding were found in the                            gastric fundus and in the gastric body. These  polyps were removed with a cold biopsy forceps.                            Resection and retrieval were complete. Estimated                            blood loss was minimal.                           Scattered mild inflammation characterized by                            erythema was found in the gastric fundus, in the                            gastric body and in the gastric antrum. Biopsies                            were taken with a cold forceps for Helicobacter                            pylori testing. Estimated blood loss was minimal.                           The duodenal bulb, first portion of the duodenum                            and second portion of the duodenum were normal.                            Biopsies for histology were taken with a cold                            forceps for evaluation of celiac disease. Estimated                            blood loss was minimal. Complications:            No immediate  complications. Estimated Blood Loss:     Estimated blood loss was minimal. Impression:               - LA Grade A reflux esophagitis with no bleeding.                           - Gastroesophageal flap valve classified as Hill                            Grade II (fold present, opens with respiration).                           - Multiple gastric polyps. Resected and retrieved.                           - Gastritis. Biopsied.                           -  Normal duodenal bulb, first portion of the                            duodenum and second portion of the duodenum.                            Biopsied. Recommendation:           - Patient has a contact number available for                            emergencies. The signs and symptoms of potential                            delayed complications were discussed with the                            patient. Return to normal activities tomorrow.                            Written discharge instructions were provided to the                            patient.                           - Resume previous diet.                           - Continue present medications.                           - Await pathology results.                           - Return to GI clinic at appointment to be                            scheduled.                           - Resume acid suppression therapy to promote                            mucosal healing of the noted esophagitis-                            omeprazole 40 mg/day for 4 weeks, then will reduce                            to lowest effective dose.                           - Depending on whether or not there are ongoing                            symptoms, could  be reasonable to discuss antirevlux                            surgical options, namely Transoral Incisionless                            Fundoplication (TIF), as a means to better control                            reflux and chronic cough and to  potentially stop or                            significantly reduce to the need for acid                            suppression therapy. Gerrit Heck, MD 05/23/2019 3:31:10 PM

## 2019-05-23 NOTE — Progress Notes (Signed)
Called to room to assist during endoscopic procedure.  Patient ID and intended procedure confirmed with present staff. Received instructions for my participation in the procedure from the performing physician.  

## 2019-05-25 ENCOUNTER — Telehealth: Payer: Self-pay | Admitting: *Deleted

## 2019-05-25 ENCOUNTER — Encounter: Payer: Self-pay | Admitting: Gastroenterology

## 2019-05-25 NOTE — Telephone Encounter (Signed)
  Follow up Call-  Call back number 05/23/2019  Post procedure Call Back phone  # [phone]  Permission to leave phone message Yes  Some recent data might be hidden     Patient questions:  Do you have a fever, pain , or abdominal swelling? No. Pain Score  0 *  Have you tolerated food without any problems? Yes.    Have you been able to return to your normal activities? Yes.    Do you have any questions about your discharge instructions: Diet   No. Medications  No. Follow up visit  No.  Do you have questions or concerns about your Care? No.  Actions: * If pain score is 4 or above: No action needed, pain <4.  1. Have you developed a fever since your procedure? no  2.   Have you had an respiratory symptoms (SOB or cough) since your procedure? no  3.   Have you tested positive for COVID 19 since your procedure no  4.   Have you had any family members/close contacts diagnosed with the COVID 19 since your procedure?  no   If yes to any of these questions please route to Joylene John, RN and Erenest Rasher, RN

## 2019-06-26 ENCOUNTER — Telehealth: Payer: Self-pay | Admitting: Gastroenterology

## 2019-06-26 NOTE — Telephone Encounter (Signed)
Patient had EGD 4 weeks ago. Has been taking Omeprazole 40 mg daily. She feels much better and would like to start on the lowest dose Omeprazole 20 mg daily. She would like to know is how long should she take the 20 mg dose for as she does not want to take it long term. She is not interested in the TIF procedure. Please advise on length of time for this medication.  Thanks, [employer]

## 2019-06-26 NOTE — Telephone Encounter (Signed)
Spoke to patient. She will start Omeprazole 20 mg daily for 2 weeks then every other day then as needed if symptoms are controlled.A follow up appointment has been scheduled for August.

## 2019-06-26 NOTE — Telephone Encounter (Signed)
I am glad to know that she has responded well to the omeprazole 40 mg/day. It is ok to reduce to 20 mg/day now, and will continue at that dose for 2 weeks. If she is still doing well, can decrease to every other day, and if still well controlled, ok to reduce to "as needed". Should plan for a follow-up appt with me in the next 1-2 months or so to review sxs and response to de-escalating therapy. Thanks.

## 2019-09-03 ENCOUNTER — Ambulatory Visit: Payer: BLUE CROSS/BLUE SHIELD | Admitting: Gastroenterology

## 2019-10-02 ENCOUNTER — Ambulatory Visit: Payer: BLUE CROSS/BLUE SHIELD | Admitting: Family Medicine

## 2019-11-13 ENCOUNTER — Other Ambulatory Visit: Payer: Self-pay

## 2019-11-14 ENCOUNTER — Ambulatory Visit: Payer: BLUE CROSS/BLUE SHIELD | Admitting: Family Medicine

## 2019-11-21 ENCOUNTER — Other Ambulatory Visit: Payer: Self-pay

## 2019-11-22 ENCOUNTER — Encounter: Payer: Self-pay | Admitting: Family Medicine

## 2019-11-22 ENCOUNTER — Ambulatory Visit: Payer: BLUE CROSS/BLUE SHIELD | Admitting: Family Medicine

## 2019-11-22 VITALS — BP 130/80 | HR 110 | Temp 98.4°F | Ht 63.0 in | Wt 152.0 lb

## 2019-11-22 DIAGNOSIS — M62830 Muscle spasm of back: Secondary | ICD-10-CM

## 2019-11-22 DIAGNOSIS — M62838 Other muscle spasm: Secondary | ICD-10-CM | POA: Diagnosis not present

## 2019-11-22 DIAGNOSIS — Z72820 Sleep deprivation: Secondary | ICD-10-CM | POA: Diagnosis not present

## 2019-11-22 MED ORDER — IBUPROFEN 800 MG PO TABS: 800.0000 mg | ORAL_TABLET | Freq: Three times a day (TID) | ORAL | 1 refills | Status: DC | PRN

## 2019-11-22 MED ORDER — TRAZODONE HCL 50 MG PO TABS: 50.0000 mg | ORAL_TABLET | Freq: Every day | ORAL | 2 refills | Status: DC

## 2019-11-22 NOTE — Progress Notes (Signed)
Stephanie Cherry is a 54 y.o. female  Chief Complaint  Patient presents with  . Acute Visit    insomnia/anxiety  x 2-3 months. pt has tried Melatonin, and benadryl.     HPI: Stephanie Cherry is a 54 y.o. female who complains of increased anxiety and trouble sleeping nightly on weeknights x 2-3 mo. Rarely happens on the weekend. She falls asleep easily around 10pm and wakes up around 1am. She feels like being awake then increases her anxiety and worry about trying to fall back to sleep. Pt may fall back to sleep around 5am and has to be up at 6am. When she lays down at night, she experiences heart racing, mind racing.  During the day, she notes headaches, feeling achy, trouble focusing at work as a result of not sleeping well. More irritable.  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  She has tried warm milk, glass of milk She is on HRT to help with hot flashes, which is dose help with that.   Pt also with Rt upper back and shoulder pain x 3 wks. No injury. Pt is Rt handed. Feels "sore". Worse with movement.   Past Medical History:  Diagnosis Date  . Bacterial infection   . GERD (gastroesophageal reflux disease)   . H/O: menorrhagia 07/21/10  . Libido, decreased 07/11/2010  . Yeast infection     Past Surgical History:  Procedure Laterality Date  . COLONOSCOPY  03/27/2018   Dr Collene Mares  . ESOPHAGOGASTRODUODENOSCOPY  03/27/2018   Dr Collene Mares    Social History   Socioeconomic History  . Marital status: Divorced    Spouse name: Not on file  . Number of children: Not on file  . Years of education: Not on file  . Highest education level: Not on file  Occupational History  . Not on file  Tobacco Use  . Smoking status: Never Smoker  . Smokeless tobacco: Never Used  Vaping Use  . Vaping Use: Never used  Substance and  Sexual Activity  . Alcohol use: Yes    Comment: social  . Drug use: No  . Sexual activity: Yes    Birth control/protection: Condom, I.U.D.  Other Topics Concern  . Not on file  Social History Narrative  . Not on file   Social Determinants of Health   Financial Resource Strain:   . Difficulty of Paying Living Expenses: Not on file  Food Insecurity:   . Worried About Charity fundraiser in the Last Year: Not on file  . Ran Out of Food in the Last Year: Not on file  Transportation Needs:   . Lack of Transportation (Medical): Not on file  . Lack of Transportation (Non-Medical): Not on file  Physical Activity:   . Days of Exercise per Week: Not on file  . Minutes of Exercise per Session: Not on file  Stress:   . Feeling of Stress : Not on file  Social Connections:   . Frequency of Communication with Friends and Family: Not on file  . Frequency of Social Gatherings with Friends and Family: Not on file  . Attends Religious Services: Not on file  . Active Member of Clubs or Organizations: Not on file  . Attends Archivist Meetings: Not on file  . Marital Status: Not on  file  Intimate Partner Violence:   . Fear of Current or Ex-Partner: Not on file  . Emotionally Abused: Not on file  . Physically Abused: Not on file  . Sexually Abused: Not on file    Family History  Problem Relation Age of Onset  . Hypertension Father   . Arthritis Father   . Hypertension Mother   . Arthritis Mother   . Hyperlipidemia Mother   . Lung cancer Mother   . Arthritis Brother   . Hyperlipidemia Brother   . Hypertension Brother   . Arthritis Maternal Grandmother   . Cancer Maternal Grandfather   . Colon cancer Neg Hx   . Esophageal cancer Neg Hx   . Rectal cancer Neg Hx   . Stomach cancer Neg Hx      Immunization History  Administered Date(s) Administered  . Influenza Inj Mdck Quad Pf 09/29/2017  . Influenza-Unspecified 10/10/2019  . MMR 05/31/2007  . Moderna SARS-COVID-2  Vaccination 04/21/2019, 05/19/2019  . Td 05/31/2007  . Tdap 05/31/2007    Outpatient Encounter Medications as of 11/22/2019  Medication Sig  . estradiol (VIVELLE-DOT) 0.05 MG/24HR patch APPLY 1 PATCH TWICE WEEKLY AS DIRECTED  . levocetirizine (XYZAL) 5 MG tablet Take 5 mg by mouth at bedtime.  Marland Kitchen levonorgestrel (MIRENA, 52 MG,) 20 MCG/24HR IUD Mirena 20 mcg/24 hours (6 yrs) 52 mg intrauterine device  . ibuprofen (ADVIL) 800 MG tablet Take 1 tablet (800 mg total) by mouth every 8 (eight) hours as needed.  . traZODone (DESYREL) 50 MG tablet Take 1 tablet (50 mg total) by mouth at bedtime.   No facility-administered encounter medications on file as of 11/22/2019.     ROS: Pertinent positives and negatives noted in HPI. Remainder of ROS non-contributory    Allergies  Allergen Reactions  . Sulfa Antibiotics Hives  . Sulfasalazine Hives    BP 130/80   Pulse (!) 110   Temp 98.4 F (36.9 C) (Temporal)   Ht 5' 3"  (1.6 m)   Wt 152 lb (68.9 kg)   SpO2 94%   BMI 26.93 kg/m    BP Readings from Last 3 Encounters:  11/22/19 130/80  05/23/19 119/75  04/24/19 132/86   Pulse Readings from Last 3 Encounters:  11/22/19 (!) 110  05/23/19 73  04/24/19 96     Physical Exam Constitutional:      General: She is not in acute distress.    Appearance: Normal appearance. She is not ill-appearing.  Cardiovascular:     Rate and Rhythm: Regular rhythm. Tachycardia present.     Pulses: Normal pulses.     Heart sounds: Normal heart sounds.  Pulmonary:     Effort: Pulmonary effort is normal.     Breath sounds: Normal breath sounds.  Musculoskeletal:     Right shoulder: No swelling, deformity or bony tenderness. Decreased range of motion. Normal strength.     Cervical back: Spasms and tenderness present. Pain with movement and muscular tenderness present. Decreased range of motion.  Neurological:     Mental Status: She is alert and oriented to person, place, and time.  Psychiatric:         Mood and Affect: Mood normal.        Behavior: Behavior normal.      A/P:  1. Poor sleep - discussed importance of good/proper sleep hygiene Rx: - traZODone (DESYREL) 50 MG tablet; Take 1 tablet (50 mg total) by mouth at bedtime.  Dispense: 30 tablet; Refill: 2 - f/u if no/minimal improvement  in about 2 wks Discussed plan and reviewed medications with patient, including risks, benefits, and potential side effects. Pt expressed understand. All questions answered.  2. Muscle spasm of back 3. Muscle spasm of neck - heating pad 2x/day followed by stretching/ROM exercises Rx: - ibuprofen (ADVIL) 800 MG tablet; Take 1 tablet (800 mg total) by mouth every 8 (eight) hours as needed.  Dispense: 60 tablet; Refill: 1 - pt to take BID w/ food x 5-7 days then stop  This visit occurred during the SARS-CoV-2 public health emergency.  Safety protocols were in place, including screening questions prior to the visit, additional usage of staff PPE, and extensive cleaning of exam room while observing appropriate contact time as indicated for disinfecting solutions.

## 2019-11-22 NOTE — Patient Instructions (Addendum)
Natrol delayed-release melatonin 5mg  30-85min before bed Trazodone 50mg  1 tab 30-22min before bed  Heating pad 2-3x/day followed by stretching/range of motion exercises Ibuprofen 800mg  1 tab twice per day w/ food x 7 days

## 2020-01-08 ENCOUNTER — Telehealth: Payer: Self-pay | Admitting: Family Medicine

## 2020-01-08 NOTE — Telephone Encounter (Signed)
Patient states that Trazodone is not working and would like to try something different. Please call her back at  [phone].

## 2020-01-10 NOTE — Telephone Encounter (Signed)
Spoke to patient and she stated that she was taking every night as prescribed for the first 2 weeks and it was working well. She started not taking every night and has been waking up at 4 am.  I advised that she would need a fu appt to discuss a medication change and she declined right now.  She will go back to taking it every night to see how it works. If not she will call and schedule an appt. Dm/cma

## 2020-03-17 ENCOUNTER — Telehealth: Payer: Self-pay | Admitting: Family Medicine

## 2020-03-17 ENCOUNTER — Other Ambulatory Visit: Payer: Self-pay

## 2020-03-17 DIAGNOSIS — Z72820 Sleep deprivation: Secondary | ICD-10-CM

## 2020-03-17 MED ORDER — TRAZODONE HCL 50 MG PO TABS: 50.0000 mg | ORAL_TABLET | Freq: Every day | ORAL | 2 refills | Status: DC

## 2020-03-17 NOTE — Telephone Encounter (Signed)
Last OV 11/22/19 Last fill 11/22/19  #30/2

## 2020-03-17 NOTE — Telephone Encounter (Signed)
Rx has been sent  

## 2020-03-17 NOTE — Telephone Encounter (Signed)
Pt calling requesting refill of traZODone (DESYREL) 50 MG tablet, please advise. [facility] on [address]

## 2020-04-08 ENCOUNTER — Other Ambulatory Visit: Payer: Self-pay

## 2020-04-08 NOTE — Patient Instructions (Signed)
Health Maintenance Due  Topic Date Due  . Hepatitis C Screening  Never done  . HIV Screening  Never done  . TETANUS/TDAP  05/30/2017    Depression screen PHQ 2/9 03/22/2019  Decreased Interest 0  Down, Depressed, Hopeless 0  PHQ - 2 Score 0

## 2020-04-09 ENCOUNTER — Ambulatory Visit (INDEPENDENT_AMBULATORY_CARE_PROVIDER_SITE_OTHER): Payer: BLUE CROSS/BLUE SHIELD | Admitting: Family Medicine

## 2020-04-09 ENCOUNTER — Encounter: Payer: Self-pay | Admitting: Family Medicine

## 2020-04-09 VITALS — BP 138/90 | HR 68 | Temp 97.7°F | Ht 63.0 in | Wt 153.0 lb

## 2020-04-09 DIAGNOSIS — Z0000 Encounter for general adult medical examination without abnormal findings: Secondary | ICD-10-CM

## 2020-04-09 DIAGNOSIS — Z1321 Encounter for screening for nutritional disorder: Secondary | ICD-10-CM

## 2020-04-09 DIAGNOSIS — Z13220 Encounter for screening for lipoid disorders: Secondary | ICD-10-CM | POA: Diagnosis not present

## 2020-04-09 DIAGNOSIS — Z1159 Encounter for screening for other viral diseases: Secondary | ICD-10-CM

## 2020-04-09 DIAGNOSIS — K219 Gastro-esophageal reflux disease without esophagitis: Secondary | ICD-10-CM

## 2020-04-09 DIAGNOSIS — Z72820 Sleep deprivation: Secondary | ICD-10-CM | POA: Diagnosis not present

## 2020-04-09 DIAGNOSIS — Z23 Encounter for immunization: Secondary | ICD-10-CM

## 2020-04-09 LAB — BASIC METABOLIC PANEL
BUN: 12 mg/dL (ref 6–23)
CO2: 33 mEq/L — ABNORMAL HIGH (ref 19–32)
Calcium: 10 mg/dL (ref 8.4–10.5)
Chloride: 101 mEq/L (ref 96–112)
Creatinine, Ser: 1.06 mg/dL (ref 0.40–1.20)
GFR: 59.4 mL/min — ABNORMAL LOW (ref >60.00)
Glucose, Bld: 89 mg/dL (ref 70–99)
Potassium: 4.4 mEq/L (ref 3.5–5.1)
Sodium: 140 mEq/L (ref 135–145)

## 2020-04-09 LAB — CBC
HCT: 44.3 % (ref 36.0–46.0)
Hemoglobin: 15.2 g/dL — ABNORMAL HIGH (ref 12.0–15.0)
MCHC: 34.3 g/dL (ref 30.0–36.0)
MCV: 101.2 fl — ABNORMAL HIGH (ref 78.0–100.0)
Platelets: 277 10*3/uL (ref 150.0–400.0)
RBC: 4.38 Mil/uL (ref 3.87–5.11)
RDW: 12.4 % (ref 11.5–15.5)
WBC: 5.7 10*3/uL (ref 4.0–10.5)

## 2020-04-09 LAB — LIPID PANEL
Cholesterol: 211 mg/dL — ABNORMAL HIGH (ref 0–200)
HDL: 79.8 mg/dL (ref >39.00)
LDL Cholesterol: 118 mg/dL — ABNORMAL HIGH (ref 0–99)
NonHDL: 131.4
Total CHOL/HDL Ratio: 3
Triglycerides: 69 mg/dL (ref 0.0–149.0)
VLDL: 13.8 mg/dL (ref 0.0–40.0)

## 2020-04-09 LAB — ALT: ALT: 16 U/L (ref 0–35)

## 2020-04-09 LAB — VITAMIN D 25 HYDROXY (VIT D DEFICIENCY, FRACTURES): VITD: 24.77 ng/mL — ABNORMAL LOW (ref 30.00–100.00)

## 2020-04-09 LAB — AST: AST: 20 U/L (ref 0–37)

## 2020-04-09 MED ORDER — OMEPRAZOLE 40 MG PO CPDR: 40.0000 mg | DELAYED_RELEASE_CAPSULE | Freq: Every day | ORAL | 2 refills | Status: DC

## 2020-04-09 NOTE — Addendum Note (Signed)
Addended by: Darral Dash on: 04/09/2020 09:13 AM   Modules accepted: Orders

## 2020-04-09 NOTE — Progress Notes (Signed)
Stephanie Cherry is a 55 y.o. female  Chief Complaint  Patient presents with  . Annual Exam    Pt is fasting for lab work.  Pt c/o GERD flare up.  Pt is due for Tdap.    HPI: Stephanie Cherry is a 55 y.o. female seen today for annual CPE, fasting labs. She is agreeable to Tdap vaccine.   Last PAP: 02/2019 - normal Last mammo: 09/2019 - normal Last colonoscopy: 03/2018 - Dr. Collene Mares   Dental: UTD, goes every 6 mo Vision: UTD  Med refills needed today? none   Past Medical History:  Diagnosis Date  . Bacterial infection   . GERD (gastroesophageal reflux disease)   . H/O: menorrhagia 07/21/10  . Libido, decreased 07/11/2010  . Yeast infection     Past Surgical History:  Procedure Laterality Date  . COLONOSCOPY  03/27/2018   Dr Collene Mares  . ESOPHAGOGASTRODUODENOSCOPY  03/27/2018   Dr Collene Mares    Social History   Socioeconomic History  . Marital status: Divorced    Spouse name: Not on file  . Number of children: Not on file  . Years of education: Not on file  . Highest education level: Not on file  Occupational History  . Not on file  Tobacco Use  . Smoking status: Never Smoker  . Smokeless tobacco: Never Used  Vaping Use  . Vaping Use: Never used  Substance and Sexual Activity  . Alcohol use: Yes    Comment: social  . Drug use: No  . Sexual activity: Yes    Birth control/protection: Condom, I.U.D.  Other Topics Concern  . Not on file  Social History Narrative  . Not on file   Social Determinants of Health   Financial Resource Strain: Not on file  Food Insecurity: Not on file  Transportation Needs: Not on file  Physical Activity: Not on file  Stress: Not on file  Social Connections: Not on file  Intimate Partner Violence: Not on file    Family History  Problem Relation Age of Onset  . Hypertension Father   . Arthritis Father   . Hypertension Mother   . Arthritis Mother   . Hyperlipidemia Mother   . Lung cancer Mother   . Arthritis Brother   . Hyperlipidemia  Brother   . Hypertension Brother   . Arthritis Maternal Grandmother   . Cancer Maternal Grandfather   . Colon cancer Neg Hx   . Esophageal cancer Neg Hx   . Rectal cancer Neg Hx   . Stomach cancer Neg Hx      Immunization History  Administered Date(s) Administered  . Influenza Inj Mdck Quad Pf 09/29/2017  . Influenza-Unspecified 10/10/2019  . MMR 05/31/2007  . Moderna Sars-Covid-2 Vaccination 04/21/2019, 05/19/2019  . Td 05/31/2007  . Tdap 05/31/2007    Outpatient Encounter Medications as of 04/09/2020  Medication Sig  . estradiol (VIVELLE-DOT) 0.05 MG/24HR patch APPLY 1 PATCH TWICE WEEKLY AS DIRECTED  . ibuprofen (ADVIL) 800 MG tablet Take 1 tablet (800 mg total) by mouth every 8 (eight) hours as needed.  Marland Kitchen levonorgestrel (MIRENA, 52 MG,) 20 MCG/24HR IUD Mirena 20 mcg/24 hours (6 yrs) 52 mg intrauterine device  . omeprazole (PRILOSEC) 40 MG capsule Take 1 capsule (40 mg total) by mouth daily.  . traZODone (DESYREL) 50 MG tablet Take 1 tablet (50 mg total) by mouth at bedtime.  . [DISCONTINUED] levocetirizine (XYZAL) 5 MG tablet Take 5 mg by mouth at bedtime.   No facility-administered encounter medications  on file as of 04/09/2020.     ROS: Gen: no fever, chills  Skin: no rash, itching ENT: no ear pain, ear drainage, nasal congestion, rhinorrhea, sinus pressure, sore throat Eyes: no blurry vision, double vision Resp: no cough, wheeze,SOB CV: no CP, palpitations, LE edema,  GI: rare heartburn, no n/v/d/c, abd pain GU: no dysuria, urgency, frequency, hematuria MSK: no joint pain, myalgias, back pain Neuro: no dizziness, headache, weakness, vertigo Psych: no depression, anxiety, + insomnia   Allergies  Allergen Reactions  . Sulfa Antibiotics Hives  . Sulfasalazine Hives    BP 138/90 (BP Location: Left Arm, Patient Position: Sitting, Cuff Size: Normal)   Pulse 68   Temp 97.7 F (36.5 C) (Temporal)   Ht 5' 3" (1.6 m)   Wt 153 lb (69.4 kg)   SpO2 98%   BMI 27.10  kg/m   Wt Readings from Last 3 Encounters:  04/09/20 153 lb (69.4 kg)  11/22/19 152 lb (68.9 kg)  05/23/19 157 lb (71.2 kg)   Temp Readings from Last 3 Encounters:  04/09/20 97.7 F (36.5 C) (Temporal)  11/22/19 98.4 F (36.9 C) (Temporal)  05/23/19 (!) 96.6 F (35.9 C) (Oral)   BP Readings from Last 3 Encounters:  04/09/20 138/90  11/22/19 130/80  05/23/19 119/75   Pulse Readings from Last 3 Encounters:  04/09/20 68  11/22/19 (!) 110  05/23/19 73     Physical Exam Constitutional:      General: She is not in acute distress.    Appearance: She is well-developed.  HENT:     Head: Normocephalic and atraumatic.     Right Ear: Tympanic membrane and ear canal normal.     Left Ear: Tympanic membrane and ear canal normal.     Nose: Nose normal.     Mouth/Throat:     Mouth: Mucous membranes are moist.     Pharynx: Oropharynx is clear.  Eyes:     Conjunctiva/sclera: Conjunctivae normal.  Neck:     Thyroid: No thyromegaly.  Cardiovascular:     Rate and Rhythm: Normal rate and regular rhythm.     Heart sounds: Normal heart sounds. No murmur heard.   Pulmonary:     Effort: Pulmonary effort is normal. No respiratory distress.     Breath sounds: Normal breath sounds. No wheezing or rhonchi.  Abdominal:     General: Bowel sounds are normal. There is no distension.     Palpations: Abdomen is soft. There is no mass.     Tenderness: There is no abdominal tenderness.  Musculoskeletal:     Cervical back: Neck supple.     Right lower leg: No edema.     Left lower leg: No edema.  Lymphadenopathy:     Cervical: No cervical adenopathy.  Skin:    General: Skin is warm and dry.  Neurological:     Mental Status: She is alert and oriented to person, place, and time.     Motor: No abnormal muscle tone.     Coordination: Coordination normal.  Psychiatric:        Behavior: Behavior normal.     A/P:  1. Annual physical exam - discussed importance of regular CV exercise,  healthy diet, adequate sleep  - UTD on dental and vision - UTD on colonoscopy, mammo, PAP - immunizations UTD - CBC - Basic metabolic panel - AST - ALT - next CPE in 1 year  2. Poor sleep - controlled - cont trazodone 6m qHS  3. Encounter for  hepatitis C screening test for low risk patient - Hepatitis C Antibody  4. Screening for lipid disorders - Lipid panel  5. Encounter for vitamin deficiency screening - VITAMIN D 25 Hydroxy (Vit-D Deficiency, Fractures)  6. Gastroesophageal reflux disease, unspecified whether esophagitis present - intermittent symptoms Refill: - omeprazole (PRILOSEC) 40 MG capsule; Take 1 capsule (40 mg total) by mouth daily.  Dispense: 30 capsule; Refill: 2   This visit occurred during the SARS-CoV-2 public health emergency.  Safety protocols were in place, including screening questions prior to the visit, additional usage of staff PPE, and extensive cleaning of exam room while observing appropriate contact time as indicated for disinfecting solutions.  

## 2020-04-10 ENCOUNTER — Encounter: Payer: Self-pay | Admitting: Family Medicine

## 2020-04-10 LAB — HEPATITIS C ANTIBODY
Hepatitis C Ab: NONREACTIVE
SIGNAL TO CUT-OFF: 0.01 (ref <1.00)

## 2020-06-16 ENCOUNTER — Other Ambulatory Visit: Payer: Self-pay | Admitting: Family Medicine

## 2020-06-16 ENCOUNTER — Telehealth: Payer: Self-pay

## 2020-06-16 DIAGNOSIS — Z72820 Sleep deprivation: Secondary | ICD-10-CM

## 2020-06-16 NOTE — Telephone Encounter (Signed)
Last OV 04/09/20 Last fill 03/17/20  #30/2

## 2020-07-10 ENCOUNTER — Telehealth: Payer: Self-pay | Admitting: Family Medicine

## 2020-07-10 DIAGNOSIS — Z72820 Sleep deprivation: Secondary | ICD-10-CM

## 2020-07-10 MED ORDER — TRAZODONE HCL 50 MG PO TABS: 50.0000 mg | ORAL_TABLET | Freq: Every day | ORAL | 0 refills | Status: DC

## 2020-07-10 NOTE — Telephone Encounter (Signed)
Rx sent today.

## 2020-07-10 NOTE — Telephone Encounter (Signed)
Pt is very anxious because she did not get a year's supply of this script. I tried to make her an appointment for a TOC for a med check. She does not want to do that. She is wanting a cb on why she can not have a year's supply. Please advise.

## 2020-07-10 NOTE — Telephone Encounter (Signed)
Last OV 04/09/2020 Last fill 06/16/20 #30/0

## 2020-07-10 NOTE — Telephone Encounter (Signed)
What is the name of the medication? traZODone (DESYREL) 50 MG tablet [[id number]]   Have you contacted your pharmacy to request a refill? Pt is wanting a refill on this script. She would like it to be a year's worth.   Which pharmacy would you like this sent to? [facility]  [address] [place] [address]  Phone:  [phone]  Fax:  [phone]      Patient notified that their request is being sent to the clinical staff for review and that they should receive a call once it is complete. If they do not receive a call within 72 hours they can check with their pharmacy or our office.

## 2020-07-11 MED ORDER — TRAZODONE HCL 50 MG PO TABS: 50.0000 mg | ORAL_TABLET | Freq: Every day | ORAL | 3 refills | Status: DC

## 2020-07-11 NOTE — Telephone Encounter (Signed)
Med refilled 90 day supply w 3RF. Please call pt to make her aware

## 2020-07-11 NOTE — Telephone Encounter (Signed)
Pt informed

## 2020-07-11 NOTE — Addendum Note (Signed)
Addended by: Ronnald Nian on: 07/11/2020 10:15 AM   Modules accepted: Orders

## 2020-09-29 LAB — HM MAMMOGRAPHY

## 2020-12-01 ENCOUNTER — Telehealth: Payer: Self-pay

## 2020-12-01 NOTE — Telephone Encounter (Signed)
Patient called asking if Dr. Volanda Napoleon will accept her as a new patient because her PCP has left the practice I informed patient Dr. Volanda Napoleon is not accepting new patients but I can send a message and if she accepts we will call to schedule a new patient appt.

## 2020-12-08 NOTE — Telephone Encounter (Signed)
Patient called again to follow up on if Dr.Banks would accept her as a new patient. I went back to ask but Dr.Banks was on a call with another patient. I let patient know this and explained that we would just have to wait for Dr.Banks' decision. Patient verbalized understanding and stated she would wait for phone call. Patient was given number for another office that may still be accepting new patients.       Please advise

## 2020-12-11 NOTE — Telephone Encounter (Signed)
Appears pt has an upcoming appt with a provider.

## 2020-12-24 ENCOUNTER — Other Ambulatory Visit: Payer: Self-pay

## 2020-12-24 ENCOUNTER — Ambulatory Visit: Payer: BLUE CROSS/BLUE SHIELD | Admitting: Nurse Practitioner

## 2020-12-24 ENCOUNTER — Encounter: Payer: Self-pay | Admitting: Nurse Practitioner

## 2020-12-24 VITALS — BP 122/74 | HR 86 | Temp 98.2°F | Resp 16 | Wt 153.2 lb

## 2020-12-24 DIAGNOSIS — G8929 Other chronic pain: Secondary | ICD-10-CM | POA: Diagnosis not present

## 2020-12-24 DIAGNOSIS — F5101 Primary insomnia: Secondary | ICD-10-CM | POA: Diagnosis not present

## 2020-12-24 DIAGNOSIS — M25511 Pain in right shoulder: Secondary | ICD-10-CM

## 2020-12-24 MED ORDER — TRAZODONE HCL 50 MG PO TABS: 50.0000 mg | ORAL_TABLET | Freq: Every day | ORAL | 3 refills | Status: DC

## 2020-12-24 MED ORDER — TRAZODONE HCL 50 MG PO TABS: 50.0000 mg | ORAL_TABLET | Freq: Every evening | ORAL | 3 refills | Status: DC | PRN

## 2020-12-24 MED ORDER — IBUPROFEN 800 MG PO TABS: 800.0000 mg | ORAL_TABLET | Freq: Three times a day (TID) | ORAL | 1 refills | Status: DC | PRN

## 2020-12-24 NOTE — Progress Notes (Signed)
Subjective:  Patient ID: Stephanie Cherry, female    DOB: 14-Nov-1965  Age: 55 y.o. MRN: [id number]  CC: Medication Refill (Pt is here for med check, ibuprofen, trazodone need refills .)   Shoulder Pain  The pain is present in the right shoulder. This is a chronic problem. The current episode started more than 1 year ago. There has been no history of extremity trauma. The problem occurs constantly. The problem has been waxing and waning. The quality of the pain is described as aching. The pain is moderate. Associated symptoms include a limited range of motion and stiffness. Pertinent negatives include no fever, inability to bear weight, itching, joint locking, joint swelling, numbness or tingling. The symptoms are aggravated by activity. She has tried NSAIDS for the symptoms. The treatment provided significant relief. Family history does not include gout or rheumatoid arthritis. Her past medical history is significant for osteoarthritis. There is no history of diabetes, gout or rheumatoid arthritis.  Primary insomnia Improved with trazodone Maintain dose Refill sent  Reviewed past Medical, Social and Family history today.  Outpatient Medications Prior to Visit  Medication Sig Dispense Refill   estradiol (VIVELLE-DOT) 0.05 MG/24HR patch APPLY 1 PATCH TWICE WEEKLY AS DIRECTED     levonorgestrel (MIRENA, 52 MG,) 20 MCG/24HR IUD Mirena 20 mcg/24 hours (6 yrs) 52 mg intrauterine device     ibuprofen (ADVIL) 800 MG tablet Take 1 tablet (800 mg total) by mouth every 8 (eight) hours as needed. 60 tablet 1   traZODone (DESYREL) 50 MG tablet Take 1 tablet (50 mg total) by mouth at bedtime. 90 tablet 3   omeprazole (PRILOSEC) 40 MG capsule Take 1 capsule (40 mg total) by mouth daily. (Patient not taking: Reported on 12/24/2020) 30 capsule 2   No facility-administered medications prior to visit.    ROS See HPI  Objective:  BP 122/74 (BP Location: Left Arm, Patient Position: Sitting, Cuff Size:  Normal)    Pulse 86    Temp 98.2 F (36.8 C) (Temporal)    Resp 16    Wt 153 lb 3.2 oz (69.5 kg)    SpO2 96%    BMI 27.14 kg/m   Physical Exam Cardiovascular:     Rate and Rhythm: Normal rate.     Pulses: Normal pulses.  Pulmonary:     Effort: Pulmonary effort is normal.  Musculoskeletal:        General: Tenderness present. No swelling or deformity.     Right shoulder: Tenderness present. No swelling, effusion, bony tenderness or crepitus. Decreased range of motion. Normal strength. Normal pulse.     Left shoulder: Normal.     Right upper arm: Normal.     Right elbow: Normal.     Right forearm: Normal.     Right wrist: Normal.     Right hand: Normal.     Cervical back: Normal range of motion and neck supple. No tenderness.  Lymphadenopathy:     Cervical: No cervical adenopathy.  Neurological:     Mental Status: She is alert and oriented to person, place, and time.   Assessment & Plan:  This visit occurred during the SARS-CoV-2 public health emergency.  Safety protocols were in place, including screening questions prior to the visit, additional usage of staff PPE, and extensive cleaning of exam room while observing appropriate contact time as indicated for disinfecting solutions.   [name] was seen today for medication refill.  Diagnoses and all orders for this visit:  Chronic right shoulder pain  Primary insomnia -     Discontinue: traZODone (DESYREL) 50 MG tablet; Take 1 tablet (50 mg total) by mouth at bedtime. -     ibuprofen (ADVIL) 800 MG tablet; Take 1 tablet (800 mg total) by mouth every 8 (eight) hours as needed. -     traZODone (DESYREL) 50 MG tablet; Take 1-2 tablets (50-100 mg total) by mouth at bedtime as needed for sleep.  Declined referral to PT at this time. Provided copy of home exercises. Advised to consider outpatient PT and/or referral to ortho/sports medicine if no improvement. Alternate between warm and cold compress Start daily shoulder exercise. Maintain  current medications.  Problem List Items Addressed This Visit       Other   Chronic right shoulder pain - Primary   Relevant Medications   ibuprofen (ADVIL) 800 MG tablet   traZODone (DESYREL) 50 MG tablet   Primary insomnia    Improved with trazodone Maintain dose Refill sent      Relevant Medications   ibuprofen (ADVIL) 800 MG tablet   traZODone (DESYREL) 50 MG tablet    Follow-up: Return in about 3 months (around 04/09/2021) for CPE (fasting).  Wilfred Lacy, NP

## 2020-12-24 NOTE — Patient Instructions (Addendum)
Alternate between warm and cold compress Start daily shoulder exercise. Maintain current medications.  Shoulder Range of Motion Exercises Shoulder range of motion (ROM) exercises are done to keep the shoulder moving freely or to increase movement. They are often recommended for people who have shoulder pain or stiffness or who are recovering from a shoulder surgery. Phase 1 exercises When you are able, do this exercise 1-2 times per day for 30-60 seconds in each direction, or as directed by your health care provider. Pendulum exercise To do this exercise while sitting: Sit in a chair or at the edge of your bed with your feet flat on the floor. Let your affected arm hang down in front of you over the edge of the bed or chair. Relax your shoulder, arm, and hand. Rock your body so your arm gently swings in small circles. You can also use your unaffected arm to start the motion. Repeat changing the direction of the circles, swinging your arm left and right, and swinging your arm forward and back. To do this exercise while standing: Stand next to a sturdy chair or table, and hold on to it with your hand on your unaffected side. Bend forward at the waist. Bend your knees slightly. Relax your shoulder, arm, and hand. While keeping your shoulder relaxed, use body motion to swing your arm in small circles. Repeat changing the direction of the circles, swinging your arm left and right, and swinging your arm forward and back. Between exercises, stand up tall and take a short break to relax your lower back.  Phase 2 exercises Do these exercises 1-2 times per day or as told by your health care provider. Hold each stretch for 30 seconds, and repeat 3 times. Do the exercises with one or both arms as instructed by your health care provider. For these exercises, sit at a table with your hand and arm supported by the table. A chair that slides easily or has wheels can be helpful. External rotation Turn  your chair so that your affected side is nearest to the table. Place your forearm on the table to your side. Bend your elbow about 90 at the elbow (right angle) and place your hand palm facing down on the table. Your elbow should be about 6 inches away from your side. Keeping your arm on the table, lean your body forward. Abduction Turn your chair so that your affected side is nearest to the table. Place your forearm and hand on the table so that your thumb points toward the ceiling and your arm is straight out to your side. Slide your hand out to the side and away from you, using your unaffected arm to do the work. To increase the stretch, you can slide your chair away from the table. Flexion: forward stretch Sit facing the table. Place your hand and elbow on the table in front of you. Slide your hand forward and away from you, using your unaffected arm to do the work. To increase the stretch, you can slide your chair backward. Phase 3 exercises Do these exercises 1-2 times per day or as told by your health care provider. Hold each stretch for 30 seconds, and repeat 3 times. Do the exercises with one or both arms as instructed by your health care provider. Cross-body stretch: posterior capsule stretch Lift your arm straight out in front of you. Bend your arm 90 at the elbow (right angle) so your forearm moves across your body. Use your other arm to gently  pull the elbow across your body, toward your other shoulder. Wall climbs Stand with your affected arm extended out to the side with your hand resting on a door frame. Slide your hand slowly up the door frame. To increase the stretch, step through the door frame. Keep your body upright and do not lean. Wand exercises You will need a cane, a piece of PVC pipe, or a sturdy wooden dowel for wand exercises. Flexion To do this exercise while standing: Hold the wand with both of your hands, palms down. Using the other arm to help, lift your  arms up and over your head, if able. Push upward with your other arm to gently increase the stretch. To do this exercise while lying down: Lie on your back with your elbows resting on the floor and the wand in both your hands. Your hands will be palm down, or pointing toward your feet. Lift your hands toward the ceiling, using your unaffected arm to help if needed. Bring your arms overhead as able, using your unaffected arm to help if needed. Internal rotation Stand while holding the wand behind you with both hands. Your unaffected arm should be extended above your head with the arm of the affected side extended behind you at the level of your waist. The wand should be pointing straight up and down as you hold it. Slowly pull the wand up behind your back by straightening the elbow of your unaffected arm and bending the elbow of your affected arm. External rotation Lie on your back with your affected upper arm supported on a small pillow or rolled towel. When you first do this exercise, keep your upper arm close to your body. Over time, bring your arm up to a 90 angle out to the side. Hold the wand across your stomach and with both hands palm up. Your elbow on your affected side should be bent at a 90 angle. Use your unaffected side to help push your forearm away from you and toward the floor. Keep your elbow on your affected side bent at a 90 angle. Contact a health care provider if you have: New or increasing pain. New numbness, tingling, weakness, or discoloration in your arm or hand. This information is not intended to replace advice given to you by your health care provider. Make sure you discuss any questions you have with your health care provider. Document Revised: 02/02/2017 Document Reviewed: 02/02/2017 Elsevier Patient Education  2022 [employer].

## 2020-12-25 ENCOUNTER — Encounter: Payer: Self-pay | Admitting: Nurse Practitioner

## 2020-12-25 DIAGNOSIS — G8929 Other chronic pain: Secondary | ICD-10-CM | POA: Insufficient documentation

## 2020-12-25 DIAGNOSIS — F5101 Primary insomnia: Secondary | ICD-10-CM | POA: Insufficient documentation

## 2020-12-25 NOTE — Assessment & Plan Note (Signed)
Onset 75months ago, no traumatic injury Works from home on computer. Pain radiates to right hand. Pain worse at night Declined referral to PT at this time. Provided copy of home exercises. Advised to consider outpatient PT and/or referral to ortho/sports medicine if no improvement. Alternate between warm and cold compress Start daily shoulder exercise. Maintain current medications.

## 2020-12-25 NOTE — Assessment & Plan Note (Signed)
Improved with trazodone Maintain dose Refill sent

## 2021-03-05 ENCOUNTER — Encounter: Payer: BLUE CROSS/BLUE SHIELD | Admitting: Nurse Practitioner

## 2021-04-14 ENCOUNTER — Ambulatory Visit (INDEPENDENT_AMBULATORY_CARE_PROVIDER_SITE_OTHER): Payer: BLUE CROSS/BLUE SHIELD | Admitting: Nurse Practitioner

## 2021-04-14 ENCOUNTER — Encounter: Payer: Self-pay | Admitting: Nurse Practitioner

## 2021-04-14 VITALS — BP 112/60 | HR 86 | Temp 97.7°F | Ht 62.75 in | Wt 156.0 lb

## 2021-04-14 DIAGNOSIS — D259 Leiomyoma of uterus, unspecified: Secondary | ICD-10-CM | POA: Insufficient documentation

## 2021-04-14 DIAGNOSIS — E559 Vitamin D deficiency, unspecified: Secondary | ICD-10-CM

## 2021-04-14 DIAGNOSIS — Z0001 Encounter for general adult medical examination with abnormal findings: Secondary | ICD-10-CM | POA: Diagnosis not present

## 2021-04-14 DIAGNOSIS — N951 Menopausal and female climacteric states: Secondary | ICD-10-CM

## 2021-04-14 DIAGNOSIS — Z13220 Encounter for screening for lipoid disorders: Secondary | ICD-10-CM

## 2021-04-14 DIAGNOSIS — Z136 Encounter for screening for cardiovascular disorders: Secondary | ICD-10-CM | POA: Diagnosis not present

## 2021-04-14 DIAGNOSIS — L659 Nonscarring hair loss, unspecified: Secondary | ICD-10-CM | POA: Diagnosis not present

## 2021-04-14 LAB — LIPID PANEL
Cholesterol: 245 mg/dL — ABNORMAL HIGH (ref 0–200)
HDL: 93 mg/dL (ref >39.00)
LDL Cholesterol: 140 mg/dL — ABNORMAL HIGH (ref 0–99)
NonHDL: 151.8
Total CHOL/HDL Ratio: 3
Triglycerides: 61 mg/dL (ref 0.0–149.0)
VLDL: 12.2 mg/dL (ref 0.0–40.0)

## 2021-04-14 LAB — COMPREHENSIVE METABOLIC PANEL
ALT: 18 U/L (ref 0–35)
AST: 24 U/L (ref 0–37)
Albumin: 4.3 g/dL (ref 3.5–5.2)
Alkaline Phosphatase: 51 U/L (ref 39–117)
BUN: 12 mg/dL (ref 6–23)
CO2: 31 mEq/L (ref 19–32)
Calcium: 9.9 mg/dL (ref 8.4–10.5)
Chloride: 102 mEq/L (ref 96–112)
Creatinine, Ser: 0.98 mg/dL (ref 0.40–1.20)
GFR: 64.81 mL/min (ref >60.00)
Glucose, Bld: 81 mg/dL (ref 70–99)
Potassium: 3.8 mEq/L (ref 3.5–5.1)
Sodium: 141 mEq/L (ref 135–145)
Total Bilirubin: 0.6 mg/dL (ref 0.2–1.2)
Total Protein: 7.4 g/dL (ref 6.0–8.3)

## 2021-04-14 LAB — CBC
HCT: 41.2 % (ref 36.0–46.0)
Hemoglobin: 14.1 g/dL (ref 12.0–15.0)
MCHC: 34.3 g/dL (ref 30.0–36.0)
MCV: 100 fl (ref 78.0–100.0)
Platelets: 271 10*3/uL (ref 150.0–400.0)
RBC: 4.12 Mil/uL (ref 3.87–5.11)
RDW: 12.4 % (ref 11.5–15.5)
WBC: 5.7 10*3/uL (ref 4.0–10.5)

## 2021-04-14 LAB — IBC + FERRITIN
Ferritin: 126.8 ng/mL (ref 10.0–291.0)
Iron: 164 ug/dL — ABNORMAL HIGH (ref 42–145)
Saturation Ratios: 56 % — ABNORMAL HIGH (ref 20.0–50.0)
TIBC: 292.6 ug/dL (ref 250.0–450.0)
Transferrin: 209 mg/dL — ABNORMAL LOW (ref 212.0–360.0)

## 2021-04-14 LAB — TSH: TSH: 0.93 u[IU]/mL (ref 0.35–5.50)

## 2021-04-14 LAB — VITAMIN D 25 HYDROXY (VIT D DEFICIENCY, FRACTURES): VITD: 49.7 ng/mL (ref 30.00–100.00)

## 2021-04-14 MED ORDER — PAROXETINE HCL 10 MG PO TABS: 10.0000 mg | ORAL_TABLET | Freq: Every day | ORAL | 5 refills | Status: DC

## 2021-04-14 NOTE — Assessment & Plan Note (Signed)
Repeat vit. D °

## 2021-04-14 NOTE — Assessment & Plan Note (Signed)
Chronic, worse x 15month with removal of IUD and discontinuation of estradiol patch. ?interferes with sleep. ?Agreed to start paxil. ?F/up in 332monthor sooner ?

## 2021-04-14 NOTE — Assessment & Plan Note (Signed)
Difficulty staying asleep, bedtime at 10pm and wakes up 2am or 3am, unable to fall asleep again. ?Minimal improvement with trazodone ?

## 2021-04-14 NOTE — Assessment & Plan Note (Signed)
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 IUD removed 02/2021. ?No use of hair color or relaxer or heat. ? ?Check TSH, CBC, iron, and vit. D ?Advised to start complete multivitamin. ?

## 2021-04-14 NOTE — Progress Notes (Incomplete)
? ?Complete physical exam ? ?Patient: Stephanie Cherry   DOB: 1965/01/08   56 y.o. Female  MRN: [id number] ?Visit Date: 04/14/2021 ? ?Subjective:  ?  ?Chief Complaint  ?Patient presents with  ? Establish Care  ?  New patient /Physical-No breast or pap exam needed, pt has GYN ?Pt is fasting.   ? ?Stephanie Cherry is a 56 y.o. female who presents today for a complete physical exam. She reports consuming a general diet. She generally feels well. She reports sleeping well. She does not have additional problems to discuss today.  ?Vision:No ?Dental:Within Last 6 months ?STD Screen:No ?Last colonoscopy 2019 by Dr. Collene Mares. ?Last mammogram 09/2020 by [facility]. ? ?Most recent fall risk assessment: ? ?  12/24/2020  ?  5:01 PM  ?Fall Risk   ?Falls in the past year? 0  ?Number falls in past yr: 0  ?Injury with Fall? 0  ?Risk for fall due to : No Fall Risks  ?Follow up Falls evaluation completed  ? ?Most recent depression screenings: ? ?  04/14/2021  ?  2:07 PM 12/24/2020  ?  5:01 PM  ?PHQ 2/9 Scores  ?PHQ - 2 Score 0 1  ?PHQ- 9 Score 4 5  ? ?HPI  ?Hair loss ?Chronic hair loss, onset after insertion of IUD 39yr ago. IUD removed 02/2021. ?No use of hair color or relaxer or heat. ? ?Check TSH, CBC, iron, and vit. D ?Advised to start complete multivitamin. ? ?Vitamin D deficiency ?Repeat vit. D ? ?Vasomotor symptoms due to menopause ?Chronic, worse x 285monthwith removal of IUD and discontinuation of estradiol patch. ?interferes with sleep. ?Agreed to start paxil. ?F/up in 55m59monthr sooner ? ?Primary insomnia ?Difficulty staying asleep, bedtime at 10pm and wakes up 2am or 3am, unable to fall asleep again. ?Minimal improvement with trazodone ? ?Past Medical History:  ?Diagnosis Date  ? Bacterial infection   ? GERD (gastroesophageal reflux disease)   ? H/O: menorrhagia 07/21/10  ? Libido, decreased 07/11/2010  ? Yeast infection   ? ?Past Surgical History:  ?Procedure Laterality Date  ? COLONOSCOPY  03/27/2018  ? Dr ManCollene Mares  ESOPHAGOGASTRODUODENOSCOPY  03/27/2018  ? Dr ManCollene Mares ?Social History  ? ?Socioeconomic History  ? Marital status: Divorced  ?  Spouse name: Not on file  ? Number of children: Not on file  ? Years of education: Not on file  ? Highest education level: Not on file  ?Occupational History  ? Not on file  ?Tobacco Use  ? Smoking status: Never  ? Smokeless tobacco: Never  ?Vaping Use  ? Vaping Use: Never used  ?Substance and Sexual Activity  ? Alcohol use: Yes  ?  Comment: social  ? Drug use: No  ? Sexual activity: Yes  ?  Birth control/protection: Condom, I.U.D.  ?Other Topics Concern  ? Not on file  ?Social History Narrative  ? Not on file  ? ?Social Determinants of Health  ? ?Financial Resource Strain: Not on file  ?Food Insecurity: Not on file  ?Transportation Needs: Not on file  ?Physical Activity: Not on file  ?Stress: Not on file  ?Social Connections: Not on file  ?Intimate Partner Violence: Not on file  ? ?Family Status  ?Relation Name Status  ? Father  Alive  ? Mother  Deceased  ? Brother  Alive  ? MGM  Alive  ? MGF  Alive  ? Neg Hx  (Not Specified)  ? ?Family History  ?Problem Relation Age of Onset  ?  Hypertension Father   ? Arthritis Father   ? Hypertension Mother   ? Arthritis Mother   ? Hyperlipidemia Mother   ? Lung cancer Mother   ? Arthritis Brother   ? Hyperlipidemia Brother   ? Hypertension Brother   ? Arthritis Maternal Grandmother   ? Cancer Maternal Grandfather   ? Colon cancer Neg Hx   ? Esophageal cancer Neg Hx   ? Rectal cancer Neg Hx   ? Stomach cancer Neg Hx   ? ?Allergies  ?Allergen Reactions  ? Sulfa Antibiotics Hives  ? Sulfasalazine Hives  ?  ?Patient Care Team: ?Grover Robinson, Charlene Brooke, NP as PCP - General (Internal Medicine) ?Delsa Bern, MD as Consulting Physician (Obstetrics and Gynecology)  ? ?Medications: ?Outpatient Medications Prior to Visit  ?Medication Sig  ? omeprazole (PRILOSEC) 40 MG capsule omeprazole 40 mg capsule,delayed release ? TAKE 1 CAPSULE BY MOUTH ONCE DAILY  ? traZODone  (DESYREL) 50 MG tablet Take 1-2 tablets (50-100 mg total) by mouth at bedtime as needed for sleep.  ? [DISCONTINUED] ibuprofen (ADVIL) 800 MG tablet Take 1 tablet (800 mg total) by mouth every 8 (eight) hours as needed.  ? [DISCONTINUED] estradiol (VIVELLE-DOT) 0.05 MG/24HR patch APPLY 1 PATCH TWICE WEEKLY AS DIRECTED (Patient not taking: Reported on 04/14/2021)  ? [DISCONTINUED] levonorgestrel (MIRENA, 52 MG,) 20 MCG/24HR IUD Mirena 20 mcg/24 hours (6 yrs) 52 mg intrauterine device (Patient not taking: Reported on 04/14/2021)  ? ?No facility-administered medications prior to visit.  ? ?Review of Systems  ?Constitutional:  Negative for fever.  ?HENT:  Negative for congestion and sore throat.   ?Eyes:   ?     Negative for visual changes  ?Respiratory:  Negative for cough and shortness of breath.   ?Cardiovascular:  Negative for chest pain, palpitations and leg swelling.  ?Gastrointestinal:  Negative for blood in stool, constipation and diarrhea.  ?Genitourinary:  Negative for dysuria, frequency and urgency.  ?Musculoskeletal:  Negative for myalgias.  ?Skin:  Negative for rash.  ?Neurological:  Negative for dizziness and headaches.  ?Hematological:  Does not bruise/bleed easily.  ?Psychiatric/Behavioral:  Positive for sleep disturbance. Negative for suicidal ideas. The patient is not nervous/anxious.   ? ?{Show previous labs (optional):23779} ?   ?Objective:  ?BP 112/60 (BP Location: Left Arm, Patient Position: Sitting, Cuff Size: Normal)   Pulse 86   Temp 97.7 ?F (36.5 ?C) (Temporal)   Ht 5' 2.75" (1.594 m)   Wt 156 lb (70.8 kg)   SpO2 97%   BMI 27.85 kg/m?  ?  ? ?{Show previous vitals signs (optional):23777} ? ?Physical Exam ?Vitals reviewed.  ?Constitutional:   ?   General: She is not in acute distress. ?HENT:  ?   Right Ear: Tympanic membrane, ear canal and external ear normal.  ?   Left Ear: Tympanic membrane, ear canal and external ear normal.  ?   Nose: Nose normal.  ?Eyes:  ?   General: No scleral  icterus. ?   Extraocular Movements: Extraocular movements intact.  ?   Conjunctiva/sclera: Conjunctivae normal.  ?Cardiovascular:  ?   Rate and Rhythm: Normal rate and regular rhythm.  ?   Pulses: Normal pulses.  ?   Heart sounds: Normal heart sounds.  ?Pulmonary:  ?   Effort: Pulmonary effort is normal. No respiratory distress.  ?   Breath sounds: Normal breath sounds.  ?Abdominal:  ?   General: Bowel sounds are normal. There is no distension.  ?   Palpations: Abdomen is soft.  ?Genitourinary: ?  Comments: Deferred breast and pelvic exam ?Musculoskeletal:     ?   General: Normal range of motion.  ?   Cervical back: Normal range of motion and neck supple.  ?   Right lower leg: No edema.  ?   Left lower leg: No edema.  ?Lymphadenopathy:  ?   Cervical: No cervical adenopathy.  ?Skin: ?   General: Skin is warm and dry.  ?Neurological:  ?   Mental Status: She is alert and oriented to person, place, and time.  ?Psychiatric:     ?   Mood and Affect: Mood normal.     ?   Behavior: Behavior normal.     ?   Thought Content: Thought content normal.  ?  ?No results found for any visits on 04/14/21. ?   ?Assessment & Plan:  ?  ?Routine Health Maintenance and Physical Exam ? ?Immunization History  ?Administered Date(s) Administered  ? Influenza Inj Mdck Quad Pf 09/29/2017  ? Influenza-Unspecified 10/05/2018, 10/10/2019, 10/23/2020  ? MMR 05/31/2007  ? Moderna Sars-Covid-2 Vaccination 04/21/2019, 05/19/2019, 12/08/2019, 05/08/2020, 09/20/2020  ? Td 05/31/2007  ? Tdap 05/31/2007, 04/09/2020  ? ?Health Maintenance  ?Topic Date Due  ? HIV Screening  Never done  ? MAMMOGRAM  09/14/2020  ? Zoster Vaccines- Shingrix (1 of 2) 07/14/2021 (Originally 05/25/2015)  ? INFLUENZA VACCINE  08/04/2021  ? PAP SMEAR-Modifier  02/26/2022  ? COLONOSCOPY (Pts 45-28yr Insurance coverage will need to be confirmed)  03/20/2028  ? TETANUS/TDAP  04/10/2030  ? COVID-19 Vaccine  Completed  ? Hepatitis C Screening  Completed  ? HPV VACCINES  Aged Out   ? ?Discussed health benefits of physical activity, and encouraged her to engage in regular exercise appropriate for her age and condition. ?Sign medical release to get last colonoscopy from Dr. MCollene Mares?Sign medical release to mammogram and PAP f

## 2021-04-14 NOTE — Patient Instructions (Addendum)
Sign medical release to get last colonoscopy from Dr. Collene Mares. ?Sign medical release to mammogram and PAP from GYN and [facility]. ? ?Start paxil at bedtime for hotflashes. ? ?Go to lab for blood draw. ? ?Preventive Care 37-56 Years Old, Female ?Preventive care refers to lifestyle choices and visits with your health care provider that can promote health and wellness. Preventive care visits are also called wellness exams. ?What can I expect for my preventive care visit? ?Counseling ?Your health care provider may ask you questions about your: ?Medical history, including: ?Past medical problems. ?Family medical history. ?Pregnancy history. ?Current health, including: ?Menstrual cycle. ?Method of birth control. ?Emotional well-being. ?Home life and relationship well-being. ?Sexual activity and sexual health. ?Lifestyle, including: ?Alcohol, nicotine or tobacco, and drug use. ?Access to firearms. ?Diet, exercise, and sleep habits. ?Work and work Statistician. ?Sunscreen use. ?Safety issues such as seatbelt and bike helmet use. ?Physical exam ?Your health care provider will check your: ?Height and weight. These may be used to calculate your BMI (body mass index). BMI is a measurement that tells if you are at a healthy weight. ?Waist circumference. This measures the distance around your waistline. This measurement also tells if you are at a healthy weight and may help predict your risk of certain diseases, such as type 2 diabetes and high blood pressure. ?Heart rate and blood pressure. ?Body temperature. ?Skin for abnormal spots. ?What immunizations do I need? ?Vaccines are usually given at various ages, according to a schedule. Your health care provider will recommend vaccines for you based on your age, medical history, and lifestyle or other factors, such as travel or where you work. ?What tests do I need? ?Screening ?Your health care provider may recommend screening tests for certain conditions. This may include: ?Lipid and  cholesterol levels. ?Diabetes screening. This is done by checking your blood sugar (glucose) after you have not eaten for a while (fasting). ?Pelvic exam and Pap test. ?Hepatitis B test. ?Hepatitis C test. ?HIV (human immunodeficiency virus) test. ?STI (sexually transmitted infection) testing, if you are at risk. ?Lung cancer screening. ?Colorectal cancer screening. ?Mammogram. Talk with your health care provider about when you should start having regular mammograms. This may depend on whether you have a family history of breast cancer. ?BRCA-related cancer screening. This may be done if you have a family history of breast, ovarian, tubal, or peritoneal cancers. ?Bone density scan. This is done to screen for osteoporosis. ?Talk with your health care provider about your test results, treatment options, and if necessary, the need for more tests. ?Follow these instructions at home: ?Eating and drinking ? ?Eat a diet that includes fresh fruits and vegetables, whole grains, lean protein, and low-fat dairy products. ?Take vitamin and mineral supplements as recommended by your health care provider. ?Do not drink alcohol if: ?Your health care provider tells you not to drink. ?You are pregnant, may be pregnant, or are planning to become pregnant. ?If you drink alcohol: ?Limit how much you have to 0-1 drink a day. ?Know how much alcohol is in your drink. In the [place] one drink equals one 12 oz bottle of beer (355 mL), one 5 oz glass of wine (148 mL), or one 1? oz glass of hard liquor (44 mL). ?Lifestyle ?Brush your teeth every morning and night with fluoride toothpaste. Floss one time each day. ?Exercise for at least 30 minutes 5 or more days each week. ?Do not use any products that contain nicotine or tobacco. These products include cigarettes, chewing tobacco, and vaping  devices, such as e-cigarettes. If you need help quitting, ask your health care provider. ?Do not use drugs. ?If you are sexually active, practice safe sex.  Use a condom or other form of protection to prevent STIs. ?If you do not wish to become pregnant, use a form of birth control. If you plan to become pregnant, see your health care provider for a prepregnancy visit. ?Take aspirin only as told by your health care provider. Make sure that you understand how much to take and what form to take. Work with your health care provider to find out whether it is safe and beneficial for you to take aspirin daily. ?Find healthy ways to manage stress, such as: ?Meditation, yoga, or listening to music. ?Journaling. ?Talking to a trusted person. ?Spending time with friends and family. ?Minimize exposure to UV radiation to reduce your risk of skin cancer. ?Safety ?Always wear your seat belt while driving or riding in a vehicle. ?Do not drive: ?If you have been drinking alcohol. Do not ride with someone who has been drinking. ?When you are tired or distracted. ?While texting. ?If you have been using any mind-altering substances or drugs. ?Wear a helmet and other protective equipment during sports activities. ?If you have firearms in your house, make sure you follow all gun safety procedures. ?Seek help if you have been physically or sexually abused. ?What's next? ?Visit your health care provider once a year for an annual wellness visit. ?Ask your health care provider how often you should have your eyes and teeth checked. ?Stay up to date on all vaccines. ?This information is not intended to replace advice given to you by your health care provider. Make sure you discuss any questions you have with your health care provider. ?Document Revised: 06/18/2020 Document Reviewed: 06/18/2020 ?Elsevier Patient Education ? 2022 [employer]. ? ? ?

## 2021-04-15 NOTE — Progress Notes (Signed)
? ?Complete physical exam ? ?Patient: Stephanie Cherry   DOB: [birth year]   56 y.o. Female  MRN: [id number] ?Visit Date: 04/15/2021 ? ?Subjective:  ?  ?Chief Complaint  ?Patient presents with  ? Establish Care  ?  New patient /Physical-No breast or pap exam needed, pt has GYN ?Pt is fasting.   ? ?Stephanie Cherry is a 56 y.o. female who presents today for a complete physical exam. She reports consuming a general diet. She generally feels well. She reports sleeping well. She does not have additional problems to discuss today.  ?Vision:No ?Dental:Within Last 6 months ?STD Screen:No ?Last colonoscopy 2019 by Dr. Collene Mares. ?Last mammogram 09/2020 by [facility]. ? ?Most recent fall risk assessment: ? ?  12/24/2020  ?  5:01 PM  ?Fall Risk   ?Falls in the past year? 0  ?Number falls in past yr: 0  ?Injury with Fall? 0  ?Risk for fall due to : No Fall Risks  ?Follow up Falls evaluation completed  ? ?Most recent depression screenings: ? ?  04/14/2021  ?  2:07 PM 12/24/2020  ?  5:01 PM  ?PHQ 2/9 Scores  ?PHQ - 2 Score 0 1  ?PHQ- 9 Score 4 5  ? ?HPI  ?Hair loss ?Chronic hair loss, onset after insertion of IUD 74yr ago. IUD removed 02/2021. ?No use of hair color or relaxer or heat. ? ?Check TSH, CBC, iron, and vit. D ?Advised to start complete multivitamin. ? ?Vitamin D deficiency ?Repeat vit. D ? ?Vasomotor symptoms due to menopause ?Chronic, worse x 2941monthwith removal of IUD and discontinuation of estradiol patch. ?interferes with sleep. ?Agreed to start paxil. ?F/up in 41m4monthr sooner ? ?Primary insomnia ?Difficulty staying asleep, bedtime at 10pm and wakes up 2am or 3am, unable to fall asleep again. ?Minimal improvement with trazodone ? ?Past Medical History:  ?Diagnosis Date  ? Bacterial infection   ? GERD (gastroesophageal reflux disease)   ? H/O: menorrhagia 07/21/10  ? Libido, decreased 07/11/2010  ? Yeast infection   ? ?Past Surgical History:  ?Procedure Laterality Date  ? COLONOSCOPY  03/27/2018  ? Dr ManCollene Mares  ESOPHAGOGASTRODUODENOSCOPY  03/27/2018  ? Dr ManCollene Mares ?Social History  ? ?Socioeconomic History  ? Marital status: Divorced  ?  Spouse name: Not on file  ? Number of children: Not on file  ? Years of education: Not on file  ? Highest education level: Not on file  ?Occupational History  ? Not on file  ?Tobacco Use  ? Smoking status: Never  ? Smokeless tobacco: Never  ?Vaping Use  ? Vaping Use: Never used  ?Substance and Sexual Activity  ? Alcohol use: Yes  ?  Comment: social  ? Drug use: No  ? Sexual activity: Yes  ?  Birth control/protection: Condom, I.U.D.  ?Other Topics Concern  ? Not on file  ?Social History Narrative  ? Not on file  ? ?Social Determinants of Health  ? ?Financial Resource Strain: Not on file  ?Food Insecurity: Not on file  ?Transportation Needs: Not on file  ?Physical Activity: Not on file  ?Stress: Not on file  ?Social Connections: Not on file  ?Intimate Partner Violence: Not on file  ? ?Family Status  ?Relation Name Status  ? Father  Alive  ? Mother  Deceased  ? Brother  Alive  ? MGM  Alive  ? MGF  Alive  ? Neg Hx  (Not Specified)  ? ?Family History  ?Problem Relation Age of Onset  ?  Hypertension Father   ? Arthritis Father   ? Hypertension Mother   ? Arthritis Mother   ? Hyperlipidemia Mother   ? Lung cancer Mother   ? Arthritis Brother   ? Hyperlipidemia Brother   ? Hypertension Brother   ? Arthritis Maternal Grandmother   ? Cancer Maternal Grandfather   ? Colon cancer Neg Hx   ? Esophageal cancer Neg Hx   ? Rectal cancer Neg Hx   ? Stomach cancer Neg Hx   ? ?Allergies  ?Allergen Reactions  ? Sulfa Antibiotics Hives  ? Sulfasalazine Hives  ?  ?Patient Care Team: ?Vernis [place]acungan, Charlene Brooke, NP as PCP - General (Internal Medicine) ?Delsa Bern, MD as Consulting Physician (Obstetrics and Gynecology)  ? ?Medications: ?Outpatient Medications Prior to Visit  ?Medication Sig  ? omeprazole (PRILOSEC) 40 MG capsule omeprazole 40 mg capsule,delayed release ? TAKE 1 CAPSULE BY MOUTH ONCE DAILY  ? traZODone  (DESYREL) 50 MG tablet Take 1-2 tablets (50-100 mg total) by mouth at bedtime as needed for sleep.  ? [DISCONTINUED] ibuprofen (ADVIL) 800 MG tablet Take 1 tablet (800 mg total) by mouth every 8 (eight) hours as needed.  ? [DISCONTINUED] estradiol (VIVELLE-DOT) 0.05 MG/24HR patch APPLY 1 PATCH TWICE WEEKLY AS DIRECTED (Patient not taking: Reported on 04/14/2021)  ? [DISCONTINUED] levonorgestrel (MIRENA, 52 MG,) 20 MCG/24HR IUD Mirena 20 mcg/24 hours (6 yrs) 52 mg intrauterine device (Patient not taking: Reported on 04/14/2021)  ? ?No facility-administered medications prior to visit.  ? ?Review of Systems  ?Constitutional:  Negative for fever.  ?HENT:  Negative for congestion and sore throat.   ?Eyes:   ?     Negative for visual changes  ?Respiratory:  Negative for cough and shortness of breath.   ?Cardiovascular:  Negative for chest pain, palpitations and leg swelling.  ?Gastrointestinal:  Negative for blood in stool, constipation and diarrhea.  ?Genitourinary:  Negative for dysuria, frequency and urgency.  ?Musculoskeletal:  Negative for myalgias.  ?Skin:  Negative for rash.  ?Neurological:  Negative for dizziness and headaches.  ?Hematological:  Does not bruise/bleed easily.  ?Psychiatric/Behavioral:  Positive for sleep disturbance. Negative for suicidal ideas. The patient is not nervous/anxious.   ? ? ?   ?Objective:  ?BP 112/60 (BP Location: Left Arm, Patient Position: Sitting, Cuff Size: Normal)   Pulse 86   Temp 97.7 ?F (36.5 ?C) (Temporal)   Ht 5' 2.75" (1.594 m)   Wt 156 lb (70.8 kg)   SpO2 97%   BMI 27.85 kg/m?  ?  ? ? ? ?Physical Exam ?Vitals reviewed.  ?Constitutional:   ?   General: She is not in acute distress. ?HENT:  ?   Right Ear: Tympanic membrane, ear canal and external ear normal.  ?   Left Ear: Tympanic membrane, ear canal and external ear normal.  ?   Nose: Nose normal.  ?Eyes:  ?   General: No scleral icterus. ?   Extraocular Movements: Extraocular movements intact.  ?   Conjunctiva/sclera:  Conjunctivae normal.  ?Cardiovascular:  ?   Rate and Rhythm: Normal rate and regular rhythm.  ?   Pulses: Normal pulses.  ?   Heart sounds: Normal heart sounds.  ?Pulmonary:  ?   Effort: Pulmonary effort is normal. No respiratory distress.  ?   Breath sounds: Normal breath sounds.  ?Abdominal:  ?   General: Bowel sounds are normal. There is no distension.  ?   Palpations: Abdomen is soft.  ?Genitourinary: ?   Comments: Deferred breast and  pelvic exam ?Musculoskeletal:     ?   General: Normal range of motion.  ?   Cervical back: Normal range of motion and neck supple.  ?   Right lower leg: No edema.  ?   Left lower leg: No edema.  ?Lymphadenopathy:  ?   Cervical: No cervical adenopathy.  ?Skin: ?   General: Skin is warm and dry.  ?Neurological:  ?   Mental Status: She is alert and oriented to person, place, and time.  ?Psychiatric:     ?   Mood and Affect: Mood normal.     ?   Behavior: Behavior normal.     ?   Thought Content: Thought content normal.  ?  ?Results for orders placed or performed in visit on 04/14/21  ?Comprehensive metabolic panel  ?Result Value Ref Range  ? Sodium 141 135 - 145 mEq/L  ? Potassium 3.8 3.5 - 5.1 mEq/L  ? Chloride 102 96 - 112 mEq/L  ? CO2 31 19 - 32 mEq/L  ? Glucose, Bld 81 70 - 99 mg/dL  ? BUN 12 6 - 23 mg/dL  ? Creatinine, Ser 0.98 0.40 - 1.20 mg/dL  ? Total Bilirubin 0.6 0.2 - 1.2 mg/dL  ? Alkaline Phosphatase 51 39 - 117 U/L  ? AST 24 0 - 37 U/L  ? ALT 18 0 - 35 U/L  ? Total Protein 7.4 6.0 - 8.3 g/dL  ? Albumin 4.3 3.5 - 5.2 g/dL  ? GFR 64.81 >60.00 mL/min  ? Calcium 9.9 8.4 - 10.5 mg/dL  ?Lipid panel  ?Result Value Ref Range  ? Cholesterol 245 (H) 0 - 200 mg/dL  ? Triglycerides 61.0 0.0 - 149.0 mg/dL  ? HDL 93.00 >39.00 mg/dL  ? VLDL 12.2 0.0 - 40.0 mg/dL  ? LDL Cholesterol 140 (H) 0 - 99 mg/dL  ? Total CHOL/HDL Ratio 3   ? NonHDL 151.80   ?CBC  ?Result Value Ref Range  ? WBC 5.7 4.0 - 10.5 K/uL  ? RBC 4.12 3.87 - 5.11 Mil/uL  ? Platelets 271.0 150.0 - 400.0 K/uL  ? Hemoglobin  14.1 12.0 - 15.0 g/dL  ? HCT 41.2 36.0 - 46.0 %  ? MCV 100.0 78.0 - 100.0 fl  ? MCHC 34.3 30.0 - 36.0 g/dL  ? RDW 12.4 11.5 - 15.5 %  ?Vitamin D (25 hydroxy)  ?Result Value Ref Range  ? VITD 49.70 30.00 - 100.00 ng/mL  ?IBC + Coralie Keens

## 2021-04-20 ENCOUNTER — Telehealth: Payer: Self-pay | Admitting: Nurse Practitioner

## 2021-04-20 NOTE — Telephone Encounter (Signed)
Pt is wanting a cb concerning her most recent lab results. Please advise at [phone] ?

## 2021-04-21 ENCOUNTER — Ambulatory Visit
Admission: RE | Admit: 2021-04-21 | Discharge: 2021-04-21 | Disposition: A | Discharge status: Home / Self Care | Payer: BLUE CROSS/BLUE SHIELD | Source: Ambulatory Visit | Attending: Nurse Practitioner | Admitting: Nurse Practitioner

## 2021-04-21 ENCOUNTER — Ambulatory Visit: Payer: BLUE CROSS/BLUE SHIELD | Admitting: Nurse Practitioner

## 2021-04-21 ENCOUNTER — Encounter: Payer: Self-pay | Admitting: Nurse Practitioner

## 2021-04-21 VITALS — BP 120/82 | HR 86 | Temp 97.8°F | Ht 62.75 in | Wt 154.6 lb

## 2021-04-21 DIAGNOSIS — R1031 Right lower quadrant pain: Secondary | ICD-10-CM

## 2021-04-21 DIAGNOSIS — K869 Disease of pancreas, unspecified: Secondary | ICD-10-CM | POA: Diagnosis not present

## 2021-04-21 DIAGNOSIS — Z831 Family history of other infectious and parasitic diseases: Secondary | ICD-10-CM | POA: Insufficient documentation

## 2021-04-21 MED ORDER — IOPAMIDOL (ISOVUE-300) INJECTION 61%
100.0000 mL | Freq: Once | INTRAVENOUS | Status: AC | PRN
  Administered 2021-04-21: 100 mL via INTRAVENOUS

## 2021-04-21 NOTE — Progress Notes (Signed)
? ?             Established Patient Visit ? ?Patient: Stephanie Cherry   DOB: Dec 31, 1965   56 y.o. Female  MRN: [id number] ?Visit Date: 04/22/2021 ? ?Subjective:  ?  ?Chief Complaint  ?Patient presents with  ? Acute Visit  ?  Pt c/o abdominal pain x 2-3 weeks, states it has gotten a little worse over the past few days.  ?Mammogram done in 09/2020, pt will sign medical release form today.   ? ?Abdominal Pain ?This is a new problem. The current episode started 1 to 4 weeks ago. The onset quality is gradual. The problem occurs constantly. The problem has been gradually worsening. The pain is located in the RLQ. The pain is severe. The quality of the pain is a sensation of fullness and cramping. The abdominal pain does not radiate. Pertinent negatives include no anorexia, arthralgias, belching, constipation, diarrhea, dysuria, fever, flatus, frequency, headaches, hematochezia, hematuria, melena, myalgias, nausea, vomiting or weight loss. Associated symptoms comments: Worse with Walking, Coughing and sneezing ?No change in GU/GU function ?No pain with intercourse. ?. The pain is aggravated by movement, palpation and certain positions. The pain is relieved by Being still. She has tried acetaminophen for the symptoms. The treatment provided no relief. Her past medical history is significant for GERD. There is no history of abdominal surgery, colon cancer, Crohn's disease, gallstones, irritable bowel syndrome, pancreatitis, PUD or ulcerative colitis.  ? ?Reviewed medical, surgical, and social history today ? ?Medications: ?Outpatient Medications Prior to Visit  ?Medication Sig  ? ibuprofen (ADVIL) 800 MG tablet Take 800 mg by mouth every 8 (eight) hours as needed.  ? omeprazole (PRILOSEC) 40 MG capsule omeprazole 40 mg capsule,delayed release ? TAKE 1 CAPSULE BY MOUTH ONCE DAILY  ? PARoxetine (PAXIL) 10 MG tablet Take 1 tablet (10 mg total) by mouth daily.  ? traZODone (DESYREL) 50 MG tablet Take 1-2 tablets (50-100 mg total)  by mouth at bedtime as needed for sleep.  ? ?No facility-administered medications prior to visit.  ? ?Reviewed past medical and social history.  ? ?ROS per HPI above ? ? ?   ?Objective:  ?There were no vitals taken for this visit. ? ?  ? ?Physical Exam ?Vitals reviewed.  ?Constitutional:   ?   General: She is not in acute distress. ?Cardiovascular:  ?   Rate and Rhythm: Normal rate.  ?   Pulses: Normal pulses.  ?Pulmonary:  ?   Effort: Pulmonary effort is normal.  ?Abdominal:  ?   General: Abdomen is flat. Bowel sounds are normal. There is no distension.  ?   Palpations: Abdomen is soft.  ?   Tenderness: There is abdominal tenderness in the right lower quadrant. There is guarding and rebound. There is no right CVA tenderness or left CVA tenderness. Positive signs include obturator sign. Negative signs include McBurney's sign.  ?   Hernia: No hernia is present. There is no hernia in the left inguinal area or right inguinal area.  ?Lymphadenopathy:  ?   Lower Body: No right inguinal adenopathy. No left inguinal adenopathy.  ?Skin: ?   Findings: No erythema or rash.  ?Neurological:  ?   Mental Status: She is alert.  ?  ?No results found for any visits on 04/21/21. ?   ?Assessment & Plan:  ?  ?Problem List Items Addressed This Visit   ? ?  ? Digestive  ? Pancreatic lesion  ?  Incidental finding. ?CT ABD/pelvis:Negative appendicitis, Normal  ovaries with uterine fibroid ?Unrelated finding: 11 mm low-density lesion on tail of pancreas.  ?Will order MRI to further investigate. ?  ?  ? Relevant Orders  ? MR Abdomen W Wo Contrast  ? ?Other Visit Diagnoses   ? ? Continuous RLQ abdominal pain    -  Primary  ? Relevant Orders  ? CT Abdomen Pelvis W Contrast (Completed)  ? Right groin pain      ? Relevant Orders  ? AMB referral to orthopedics  ? ?  ?CT ABD/pelvis:Negative appendicitis, Normal ovaries with uterine fibroid ?Unrelated finding: 11 mm low-density lesion on tail of pancreas. Will order MRI to further investigate. ?No  finding to explain current symptoms. I entered referral to ortho to evaluate right hip. ? ?No follow-ups on file. ? ?  ? ?Wilfred Lacy, NP ? ? ?

## 2021-04-21 NOTE — Patient Instructions (Signed)
You will be contacted to schedule appt for CT ABD. ?Maintain clear liquid diet till scan is completed. ? ?Clear Liquid Diet, Adult ?A clear liquid diet is a diet that includes only liquids and semi-liquids that you can see through. No solid food is eaten on this diet. Most people need to follow this diet for only a short time. ?You may need to follow a clear liquid diet if: ?You have a problem right before or after you have surgery. ?You did not eat food for a long time. ?You had any of these: ?Vomiting or feeling like you may vomit (nausea). ?Watery poop (diarrhea). ?You are going to have an exam to look at parts of your digestive system. ?You are going to have bowel surgery. ?What are the goals of this diet? ?To rest the stomach. ?To help you clear the digestive system before an exam. ?To make sure that there is enough fluid in your body. ?To make sure you get some energy. ?To help you get back to eating like you used to. ?What are tips for following this plan? ?A clear liquid is a liquid or semi-liquid that you can see through when you hold it up to a light. An example of a semi-liquid is gelatin. ?This diet does not give you all the nutrients that you need. Choose a variety of the liquids that your doctor says you can have on this diet. That way, you will get as many nutrients as possible. ?If you are not sure whether you can have certain items, ask your doctor. ?If you cannot swallow a thin liquid, you will need to thicken it before taking it. This will stop you from breathing it in (aspirating). ?What foods should I eat? ? ?Water and flavored water. ?Fruit juices that do not have pulp. This includes cranberry juice, apple juice, or grape juice. ?Tea and coffee without milk or cream. ?Clear bouillon or broth. ?Broth-based soups that have been strained. ?Flavored gelatin. ?Honey. ?Sugar water. ?Ice or frozen ice pops that do not have any milk, yogurt, fruit pieces, or fruit pulp in them. ?Clear sodas. ?Clear  sports drinks. ?The items listed above may not be a complete list of what you can eat and drink. Contact a dietitian for more options. ?What foods should I avoid? ?Juices that have pulp. ?Milk. ?Cream or cream-based soups. ?Yogurt. ?Solid foods that are not clear liquids or semi-liquids. ?The items listed above may not be a complete list of what you should not eat and drink. Contact a dietitian for more information. ?Questions to ask your doctor: ?How long do I need to follow this diet? ?Are there any medicines that I should change while on this diet? ?Summary ?A clear liquid diet is a diet that includes only liquids and semi-liquids that you can see through. ?Some goals of this diet are to rest your stomach and make sure you get enough fluid. ?Avoid liquids with milk, cream, or pulp while you are on this diet. ?This information is not intended to replace advice given to you by your health care provider. Make sure you discuss any questions you have with your health care provider. ?Document Revised: 10/09/2019 Document Reviewed: 10/09/2019 ?Elsevier Patient Education ? [place]. ? ?

## 2021-04-21 NOTE — Telephone Encounter (Signed)
Pt reviewed with provider during office visit today.  ?

## 2021-04-22 DIAGNOSIS — K869 Disease of pancreas, unspecified: Secondary | ICD-10-CM | POA: Insufficient documentation

## 2021-04-22 DIAGNOSIS — K862 Cyst of pancreas: Secondary | ICD-10-CM | POA: Insufficient documentation

## 2021-04-22 NOTE — Assessment & Plan Note (Signed)
Incidental finding. ?CT ABD/pelvis:Negative appendicitis, Normal ovaries with uterine fibroid ?Unrelated finding: 11 mm low-density lesion on tail of pancreas.  ?Will order MRI to further investigate. ?

## 2021-04-27 ENCOUNTER — Telehealth: Payer: Self-pay | Admitting: Nurse Practitioner

## 2021-04-27 NOTE — Telephone Encounter (Signed)
Pt feels her PARoxetine (PAXIL) 10 MG tablet [[id number]]  has given her insomnia for the past 3 days. She would love a call back. [phone] ?

## 2021-04-28 NOTE — Telephone Encounter (Signed)
Patient states she does not want to take medication for a issue she does not have just to help her sleep. Pt states she would like to continue taking trazodone. Pt also wanted to let you know that when she takes Tylenol PM it works great and she can sleep well but she knows she cannot take this daily, and she would like to know what she could do? ?

## 2021-04-28 NOTE — Telephone Encounter (Signed)
Patient notified and verbalized understanding. 

## 2021-05-18 ENCOUNTER — Ambulatory Visit
Admission: RE | Admit: 2021-05-18 | Discharge: 2021-05-18 | Disposition: A | Discharge status: Home / Self Care | Payer: BLUE CROSS/BLUE SHIELD | Source: Ambulatory Visit | Attending: Nurse Practitioner | Admitting: Nurse Practitioner

## 2021-05-18 DIAGNOSIS — K869 Disease of pancreas, unspecified: Secondary | ICD-10-CM

## 2021-05-18 MED ORDER — GADOBENATE DIMEGLUMINE 529 MG/ML IV SOLN
14.0000 mL | Freq: Once | INTRAVENOUS | Status: AC | PRN
Start: 2021-05-18 — End: 2021-05-18
  Administered 2021-05-18: 14 mL via INTRAVENOUS

## 2021-05-22 ENCOUNTER — Telehealth: Payer: Self-pay | Admitting: Nurse Practitioner

## 2021-05-22 ENCOUNTER — Other Ambulatory Visit: Payer: Self-pay | Admitting: Nurse Practitioner

## 2021-05-22 DIAGNOSIS — K869 Disease of pancreas, unspecified: Secondary | ICD-10-CM

## 2021-05-22 NOTE — Telephone Encounter (Signed)
Pt is wanting a call back concerning her MR Abdomen W Wo Contrast (Accession [id number]) (Order [id number]) results. Please advise at [phone]

## 2021-05-22 NOTE — Telephone Encounter (Signed)
Pt says she did get a message concerning this issue, she is wanting a call back going over the message. Please advise [phone]

## 2021-05-22 NOTE — Assessment & Plan Note (Signed)
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 No tobacco use, no ETOH abuse, no pancreatitis, no DM, no FHx of GI cancer or breast cancer Current BMI at 27  Repeat MRI in 21month Sooner if symptomatic

## 2021-05-25 NOTE — Telephone Encounter (Signed)
Talked to Stephanie Cherry about Mri ABD finding, current recommendation, and her risk of pancreatitic cancer. I answered all her questions to her questions to her satisfaction. Advised return to office if there is a change in GI function, pain, jaundice, and/or unintentional weight loss. She verbalized understanding.

## 2021-06-30 ENCOUNTER — Ambulatory Visit: Payer: BLUE CROSS/BLUE SHIELD | Admitting: Nurse Practitioner

## 2021-06-30 ENCOUNTER — Encounter: Payer: Self-pay | Admitting: Nurse Practitioner

## 2021-06-30 VITALS — BP 130/80 | HR 83 | Temp 98.2°F | Ht 62.75 in | Wt 155.2 lb

## 2021-06-30 DIAGNOSIS — M25562 Pain in left knee: Secondary | ICD-10-CM | POA: Diagnosis not present

## 2021-06-30 NOTE — Progress Notes (Signed)
Acute Office Visit  Subjective:    Patient ID: Stephanie Cherry, female    DOB: [birth year], 56 y.o.   MRN: [id number]  Chief Complaint  Patient presents with   Acute Visit    C/o left knee pain x 1 week, says she felt like she hurt it while lifting weights. Has been icing it & taking tylenol Requesting records from [facility]   Knee Pain  The incident occurred 5 to 7 days ago. The incident occurred at the gym. The pain is present in the left knee. The quality of the pain is described as aching. The pain is moderate. The pain has been Constant since onset. Pertinent negatives include no inability to bear weight, loss of motion, loss of sensation, muscle weakness, numbness or tingling. The symptoms are aggravated by palpation and movement. She has tried NSAIDs and ice for the symptoms. The treatment provided moderate relief.  Onset of pain after attempting a [place] squat (squat on one leg) and jumping jacks. Denies hearing an popping or crackling sound. Denies any previous knee injury. Pain is worse with prolonged sitting and bending knee.  Outpatient Medications Prior to Visit  Medication Sig   ibuprofen (ADVIL) 800 MG tablet Take 800 mg by mouth every 8 (eight) hours as needed.   omeprazole (PRILOSEC) 40 MG capsule omeprazole 40 mg capsule,delayed release  TAKE 1 CAPSULE BY MOUTH ONCE DAILY   traZODone (DESYREL) 50 MG tablet Take 1-2 tablets (50-100 mg total) by mouth at bedtime as needed for sleep.   PARoxetine (PAXIL) 10 MG tablet Take 1 tablet (10 mg total) by mouth daily. (Patient not taking: Reported on 06/30/2021)   No facility-administered medications prior to visit.   Reviewed past medical and social history.  Review of Systems  Neurological:  Negative for tingling and numbness.   Per HPI     Objective:    Physical Exam Musculoskeletal:     Right upper leg: Normal.     Left upper leg: Normal.     Right knee: Normal.     Left knee: Swelling present. No effusion,  erythema, ecchymosis, lacerations or crepitus. Decreased range of motion. Tenderness present over the medial joint line and MCL. No lateral joint line or patellar tendon tenderness. Normal alignment and normal patellar mobility.     Instability Tests: Anterior drawer test negative. Posterior drawer test negative.     Right lower leg: Normal.     Left lower leg: Normal.     Right ankle: Normal.     Left ankle: Normal.     Left Achilles Tendon: Normal.     Comments: Normal extension Unable to flex knee beyond 90degrees due to pain.  Skin:    Findings: No erythema.  Neurological:     Mental Status: She is alert and oriented to person, place, and time.     BP 130/80 (BP Location: Right Arm, Patient Position: Sitting, Cuff Size: Small)   Pulse 83   Temp 98.2 F (36.8 C) (Temporal)   Ht 5' 2.75" (1.594 m)   Wt 155 lb 3.2 oz (70.4 kg)   SpO2 98%   BMI 27.71 kg/m    No results found for any visits on 06/30/21.      Assessment & Plan:   Problem List Items Addressed This Visit   None Visit Diagnoses     Left medial knee pain    -  Primary     Meniscus tear vs MCL sprain. Advised to avoid any activity which  makes symptoms worse. Use voltaren gel 3-4x/day or ibuprofen 800mg  every 8hrs as needed with food Ok to combine above medication with tylenol 500mg  every 6hrs as needed for pain Alternate between warm and cold compress Use knee sleeve during the day and off at night. Call office if no improvement in 6weeks.  No orders of the defined types were placed in this encounter.  Return if symptoms worsen or fail to improve.  Alysia Penna, NP

## 2021-07-17 ENCOUNTER — Telehealth: Payer: Self-pay | Admitting: Nurse Practitioner

## 2021-07-17 NOTE — Telephone Encounter (Signed)
Pt would like you to give her a call about her knee

## 2021-07-17 NOTE — Telephone Encounter (Signed)
error 

## 2021-07-17 NOTE — Telephone Encounter (Signed)
LVM, adv pt to call back

## 2021-07-20 NOTE — Telephone Encounter (Signed)
Called & spoke w/ pt, says that she has been using warm & cold compresses for her knee this weekend. Says when she called last week on Friday, she couldn't walk on her leg but that has gotten better since. Offered pt an appt to be seen to have her knee X-rayed due to her request. Pt declined and said she will call back at a later date because an appt i'snt neccessary right now.

## 2021-07-27 ENCOUNTER — Ambulatory Visit: Payer: BLUE CROSS/BLUE SHIELD | Admitting: Nurse Practitioner

## 2021-09-01 ENCOUNTER — Other Ambulatory Visit: Payer: Self-pay | Admitting: Nurse Practitioner

## 2021-09-01 DIAGNOSIS — F5101 Primary insomnia: Secondary | ICD-10-CM

## 2021-09-01 NOTE — Telephone Encounter (Signed)
Caller Name: pt Call back phone #: [phone]   MEDICATION(S): traZODone (DESYREL) 50 MG tablet [[id number]]  and  ibuprofen (ADVIL) 800 MG tablet [[id number]]   Days of Med Remaining: several for both  Has the patient contacted their pharmacy (YES/NO)? Yes, contact your PCP   Preferred Pharmacy: [facility], [place].  [address] [place] [address]  Phone:  [phone]  Fax:  [phone]

## 2021-09-02 ENCOUNTER — Other Ambulatory Visit: Payer: Self-pay

## 2021-09-02 NOTE — Telephone Encounter (Signed)
Chart supports Rx Last OV: 06/2021 Next OV: not scheduled    

## 2021-09-04 MED ORDER — IBUPROFEN 800 MG PO TABS: 800.0000 mg | ORAL_TABLET | Freq: Three times a day (TID) | ORAL | 0 refills | Status: DC | PRN

## 2021-09-04 MED ORDER — TRAZODONE HCL 50 MG PO TABS: 50.0000 mg | ORAL_TABLET | Freq: Every evening | ORAL | 0 refills | Status: DC | PRN

## 2021-09-08 ENCOUNTER — Telehealth: Payer: Self-pay | Admitting: Nurse Practitioner

## 2021-09-08 NOTE — Telephone Encounter (Signed)
Caller Name: Elim Peale Call back phone #: [phone]  Reason for Call: Trazodone, pt just picked this up. Please call her to go over a few questions she has about the medication

## 2021-09-08 NOTE — Telephone Encounter (Signed)
Called & spoke w/ pt, says she received her rx and it says that she needs to come back to be seen in order to receive further refills for her trazodone. Pt says she does not like how that sounds and she feels like she shouldn't have to come back ever so often just to get a refill. I explained to pt that this is considered a control and it requires a f/u w/ Baldo Ash. Pt states that she is not please with this is she will be looking for a new PCP.

## 2021-10-01 LAB — HM MAMMOGRAPHY

## 2021-10-21 ENCOUNTER — Ambulatory Visit: Payer: BLUE CROSS/BLUE SHIELD | Admitting: Nurse Practitioner

## 2021-10-21 ENCOUNTER — Encounter: Payer: Self-pay | Admitting: Nurse Practitioner

## 2021-10-21 VITALS — BP 126/80 | HR 75 | Temp 97.7°F | Ht 62.0 in | Wt 151.6 lb

## 2021-10-21 DIAGNOSIS — K869 Disease of pancreas, unspecified: Secondary | ICD-10-CM

## 2021-10-21 DIAGNOSIS — F5101 Primary insomnia: Secondary | ICD-10-CM

## 2021-10-21 DIAGNOSIS — N951 Menopausal and female climacteric states: Secondary | ICD-10-CM

## 2021-10-21 MED ORDER — TRAZODONE HCL 100 MG PO TABS: 100.0000 mg | ORAL_TABLET | Freq: Every evening | ORAL | 3 refills | Status: DC | PRN

## 2021-10-21 NOTE — Assessment & Plan Note (Signed)
No N/V or ABD pain or change in GI function or unintentional weight loss. Repeat MRI ABD. She opted to take MRI order to external facility. She will have report faxed to me once completed

## 2021-10-21 NOTE — Assessment & Plan Note (Signed)
Unable to tolerate paxil: worsening insomnia Denies need for another medication at this time

## 2021-10-21 NOTE — Progress Notes (Signed)
                Established Patient Visit  Patient: Stephanie Cherry   DOB: 15-Feb-1965   56 y.o. Female  MRN: [id number] Visit Date: 10/21/2021  Subjective:    Chief Complaint  Patient presents with   Office Visit    Trazadone refill Would like another referral to have her pancrease looked at again No concerns    HPI Vasomotor symptoms due to menopause Unable to tolerate paxil: worsening insomnia Denies need for another medication at this time  Primary insomnia Improved sleep with trazodone '100mg'$  at HS Refill sent  Pancreatic lesion No N/V or ABD pain or change in GI function or unintentional weight loss. Repeat MRI ABD. She opted to take MRI order to external facility. She will have report faxed to me once completed  Reviewed medical, surgical, and social history today  Medications: Outpatient Medications Prior to Visit  Medication Sig   ibuprofen (ADVIL) 800 MG tablet Take 1 tablet (800 mg total) by mouth every 8 (eight) hours as needed. With food   omeprazole (PRILOSEC) 40 MG capsule omeprazole 40 mg capsule,delayed release  TAKE 1 CAPSULE BY MOUTH ONCE DAILY   [DISCONTINUED] traZODone (DESYREL) 50 MG tablet Take 1-2 tablets (50-100 mg total) by mouth at bedtime as needed for sleep. Need appt for additional refills   [DISCONTINUED] PARoxetine (PAXIL) 10 MG tablet Take 1 tablet (10 mg total) by mouth daily. (Patient not taking: Reported on 06/30/2021)   No facility-administered medications prior to visit.   Reviewed past medical and social history.   ROS per HPI above      Objective:  BP 126/80 (BP Location: Right Arm, Patient Position: Sitting, Cuff Size: Normal)   Pulse 75   Temp 97.7 F (36.5 C) (Temporal)   Ht '5\' 2"'$  (1.575 m)   Wt 151 lb 9.6 oz (68.8 kg)   SpO2 98%   BMI 27.73 kg/m      Physical Exam  No results found for any visits on 10/21/21.    Assessment & Plan:    Problem List Items Addressed This Visit       Digestive   Pancreatic lesion     No N/V or ABD pain or change in GI function or unintentional weight loss. Repeat MRI ABD. She opted to take MRI order to external facility. She will have report faxed to me once completed      Relevant Orders   MR Abdomen W Wo Contrast     Other   Primary insomnia - Primary    Improved sleep with trazodone '100mg'$  at HS Refill sent      Relevant Medications   traZODone (DESYREL) 100 MG tablet   Vasomotor symptoms due to menopause    Unable to tolerate paxil: worsening insomnia Denies need for another medication at this time      Return in about 6 months (around 04/22/2022) for CPE (fasting).     Wilfred Lacy, NP

## 2021-10-21 NOTE — Assessment & Plan Note (Signed)
Improved sleep with trazodone '100mg'$  at HS Refill sent

## 2021-12-17 ENCOUNTER — Other Ambulatory Visit: Payer: Self-pay | Admitting: Nurse Practitioner

## 2021-12-17 MED ORDER — IBUPROFEN 800 MG PO TABS: 800.0000 mg | ORAL_TABLET | Freq: Three times a day (TID) | ORAL | 0 refills | Status: AC | PRN

## 2022-04-06 ENCOUNTER — Telehealth: Payer: Self-pay | Admitting: Nurse Practitioner

## 2022-04-06 ENCOUNTER — Ambulatory Visit (INDEPENDENT_AMBULATORY_CARE_PROVIDER_SITE_OTHER): Payer: Managed Care, Other (non HMO) | Admitting: Nurse Practitioner

## 2022-04-06 ENCOUNTER — Encounter: Payer: Self-pay | Admitting: Nurse Practitioner

## 2022-04-06 VITALS — BP 119/80 | HR 81 | Temp 98.2°F | Resp 16 | Ht 62.0 in | Wt 153.2 lb

## 2022-04-06 DIAGNOSIS — Z0000 Encounter for general adult medical examination without abnormal findings: Secondary | ICD-10-CM | POA: Diagnosis not present

## 2022-04-06 DIAGNOSIS — E7800 Pure hypercholesterolemia, unspecified: Secondary | ICD-10-CM

## 2022-04-06 DIAGNOSIS — R7989 Other specified abnormal findings of blood chemistry: Secondary | ICD-10-CM

## 2022-04-06 DIAGNOSIS — K869 Disease of pancreas, unspecified: Secondary | ICD-10-CM | POA: Diagnosis not present

## 2022-04-06 DIAGNOSIS — Z0001 Encounter for general adult medical examination with abnormal findings: Secondary | ICD-10-CM

## 2022-04-06 LAB — CBC WITH DIFFERENTIAL/PLATELET
Basophils Absolute: 0 10*3/uL (ref 0.0–0.1)
Basophils Relative: 0.5 % (ref 0.0–3.0)
Eosinophils Absolute: 0 10*3/uL (ref 0.0–0.7)
Eosinophils Relative: 0.5 % (ref 0.0–5.0)
HCT: 43.3 % (ref 36.0–46.0)
Hemoglobin: 14.9 g/dL (ref 12.0–15.0)
Lymphocytes Relative: 36.6 % (ref 12.0–46.0)
Lymphs Abs: 1.7 10*3/uL (ref 0.7–4.0)
MCHC: 34.3 g/dL (ref 30.0–36.0)
MCV: 99.8 fl (ref 78.0–100.0)
Monocytes Absolute: 0.5 10*3/uL (ref 0.1–1.0)
Monocytes Relative: 10.7 % (ref 3.0–12.0)
Neutro Abs: 2.4 10*3/uL (ref 1.4–7.7)
Neutrophils Relative %: 51.7 % (ref 43.0–77.0)
Platelets: 277 10*3/uL (ref 150.0–400.0)
RBC: 4.34 Mil/uL (ref 3.87–5.11)
RDW: 12.3 % (ref 11.5–15.5)
WBC: 4.7 10*3/uL (ref 4.0–10.5)

## 2022-04-06 LAB — LIPID PANEL
Cholesterol: 228 mg/dL — ABNORMAL HIGH (ref 0–200)
HDL: 81.2 mg/dL (ref >39.00)
LDL Cholesterol: 132 mg/dL — ABNORMAL HIGH (ref 0–99)
NonHDL: 146.43
Total CHOL/HDL Ratio: 3
Triglycerides: 70 mg/dL (ref 0.0–149.0)
VLDL: 14 mg/dL (ref 0.0–40.0)

## 2022-04-06 LAB — COMPREHENSIVE METABOLIC PANEL
ALT: 13 U/L (ref 0–35)
AST: 20 U/L (ref 0–37)
Albumin: 4.5 g/dL (ref 3.5–5.2)
Alkaline Phosphatase: 90 U/L (ref 39–117)
BUN: 14 mg/dL (ref 6–23)
CO2: 33 mEq/L — ABNORMAL HIGH (ref 19–32)
Calcium: 10.1 mg/dL (ref 8.4–10.5)
Chloride: 101 mEq/L (ref 96–112)
Creatinine, Ser: 0.99 mg/dL (ref 0.40–1.20)
GFR: 63.58 mL/min (ref >60.00)
Glucose, Bld: 88 mg/dL (ref 70–99)
Potassium: 3.9 mEq/L (ref 3.5–5.1)
Sodium: 140 mEq/L (ref 135–145)
Total Bilirubin: 0.4 mg/dL (ref 0.2–1.2)
Total Protein: 7.7 g/dL (ref 6.0–8.3)

## 2022-04-06 NOTE — Telephone Encounter (Signed)
Pt is wanting a cb concerning additional information concerning her medication. She had labs today and she feels this will effect the prescriptions she may get. Please advise pt at [phone]

## 2022-04-06 NOTE — Patient Instructions (Addendum)
Schedule appt with Dr. Collene Mares to discuss management of pancreatic cyst. Have PAP result faxed to me Go to lab  Preventive Care 57-57 Years Old, Female Preventive care refers to lifestyle choices and visits with your health care provider that can promote health and wellness. Preventive care visits are also called wellness exams. What can I expect for my preventive care visit? Counseling Your health care provider may ask you questions about your: Medical history, including: Past medical problems. Family medical history. Pregnancy history. Current health, including: Menstrual cycle. Method of birth control. Emotional well-being. Home life and relationship well-being. Sexual activity and sexual health. Lifestyle, including: Alcohol, nicotine or tobacco, and drug use. Access to firearms. Diet, exercise, and sleep habits. Work and work Statistician. Sunscreen use. Safety issues such as seatbelt and bike helmet use. Physical exam Your health care provider will check your: Height and weight. These may be used to calculate your BMI (body mass index). BMI is a measurement that tells if you are at a healthy weight. Waist circumference. This measures the distance around your waistline. This measurement also tells if you are at a healthy weight and may help predict your risk of certain diseases, such as type 2 diabetes and high blood pressure. Heart rate and blood pressure. Body temperature. Skin for abnormal spots. What immunizations do I need?  Vaccines are usually given at various ages, according to a schedule. Your health care provider will recommend vaccines for you based on your age, medical history, and lifestyle or other factors, such as travel or where you work. What tests do I need? Screening Your health care provider may recommend screening tests for certain conditions. This may include: Lipid and cholesterol levels. Diabetes screening. This is done by checking your blood sugar  (glucose) after you have not eaten for a while (fasting). Pelvic exam and Pap test. Hepatitis B test. Hepatitis C test. HIV (human immunodeficiency virus) test. STI (sexually transmitted infection) testing, if you are at risk. Lung cancer screening. Colorectal cancer screening. Mammogram. Talk with your health care provider about when you should start having regular mammograms. This may depend on whether you have a family history of breast cancer. BRCA-related cancer screening. This may be done if you have a family history of breast, ovarian, tubal, or peritoneal cancers. Bone density scan. This is done to screen for osteoporosis. Talk with your health care provider about your test results, treatment options, and if necessary, the need for more tests. Follow these instructions at home: Eating and drinking  Eat a diet that includes fresh fruits and vegetables, whole grains, lean protein, and low-fat dairy products. Take vitamin and mineral supplements as recommended by your health care provider. Do not drink alcohol if: Your health care provider tells you not to drink. You are pregnant, may be pregnant, or are planning to become pregnant. If you drink alcohol: Limit how much you have to 0-1 drink a day. Know how much alcohol is in your drink. In the [place] one drink equals one 12 oz bottle of beer (355 mL), one 5 oz glass of wine (148 mL), or one 1 oz glass of hard liquor (44 mL). Lifestyle Brush your teeth every morning and night with fluoride toothpaste. Floss one time each day. Exercise for at least 30 minutes 5 or more days each week. Do not use any products that contain nicotine or tobacco. These products include cigarettes, chewing tobacco, and vaping devices, such as e-cigarettes. If you need help quitting, ask your health care provider.  Do not use drugs. If you are sexually active, practice safe sex. Use a condom or other form of protection to prevent STIs. If you do not wish to  become pregnant, use a form of birth control. If you plan to become pregnant, see your health care provider for a prepregnancy visit. Take aspirin only as told by your health care provider. Make sure that you understand how much to take and what form to take. Work with your health care provider to find out whether it is safe and beneficial for you to take aspirin daily. Find healthy ways to manage stress, such as: Meditation, yoga, or listening to music. Journaling. Talking to a trusted person. Spending time with friends and family. Minimize exposure to UV radiation to reduce your risk of skin cancer. Safety Always wear your seat belt while driving or riding in a vehicle. Do not drive: If you have been drinking alcohol. Do not ride with someone who has been drinking. When you are tired or distracted. While texting. If you have been using any mind-altering substances or drugs. Wear a helmet and other protective equipment during sports activities. If you have firearms in your house, make sure you follow all gun safety procedures. Seek help if you have been physically or sexually abused. What's next? Visit your health care provider once a year for an annual wellness visit. Ask your health care provider how often you should have your eyes and teeth checked. Stay up to date on all vaccines. This information is not intended to replace advice given to you by your health care provider. Make sure you discuss any questions you have with your health care provider. Document Revised: 06/18/2020 Document Reviewed: 06/18/2020 Elsevier Patient Education  [place].

## 2022-04-06 NOTE — Telephone Encounter (Signed)
Called patient back and she wanted to know if Biotin would affect her iron levels.  I let her know Biotin is part of the B vitamins and it should not affect her Iron levels.

## 2022-04-06 NOTE — Progress Notes (Signed)
Complete physical exam  Patient: Stephanie Cherry   DOB: May 18, 1965   57 y.o. Female  MRN: [id number] Visit Date: 04/06/2022  Subjective:    Chief Complaint  Patient presents with   Annual Exam    Fasting- Yes    Stephanie Cherry is a 57 y.o. female who presents today for a complete physical exam. She reports consuming a general diet.  3x/week of weight training and cardio exercise  She generally feels well. She reports sleeping well. She does have additional problems to discuss today.  Vision:No Dental:Yes STD Screen:No  BP Readings from Last 3 Encounters:  04/06/22 119/80  10/21/21 126/80  06/30/21 130/80   Wt Readings from Last 3 Encounters:  04/06/22 153 lb 3.2 oz (69.5 kg)  10/21/21 151 lb 9.6 oz (68.8 kg)  06/30/21 155 lb 3.2 oz (70.4 kg)   Most recent fall risk assessment:    04/06/2022    8:40 AM  Fall Risk   Falls in the past year? 0  Number falls in past yr: 0  Injury with Fall? 0  Risk for fall due to : No Fall Risks  Follow up Falls evaluation completed   Depression screen:Yes - No Depression  Most recent depression screenings:    04/06/2022    8:41 AM 04/14/2021    2:07 PM  PHQ 2/9 Scores  PHQ - 2 Score 0 0  PHQ- 9 Score  4   HPI  Pancreatic lesion Repeat MRI 10/2021 indicates decrease to size: 50mm to 69mm. Mild irregularity of the wall of the lesion, subtle peripheral nodular enhancement. No communication with pancreatic duct.  She denies any GI symptoms, no weight loss Offered to entered GI referral to [place], but she opted to f/up with Dr. Collene Mares [facility]). Faxed copy of MRI reports to Dr. Collene Mares.  Past Medical History:  Diagnosis Date   Bacterial infection    GERD (gastroesophageal reflux disease)    H/O: menorrhagia 07/21/2010   Libido, decreased 07/11/2010   Yeast infection    Past Surgical History:  Procedure Laterality Date   COLONOSCOPY  03/27/2018   Dr Collene Mares   ESOPHAGOGASTRODUODENOSCOPY  03/27/2018   Dr Collene Mares   Social  History   Socioeconomic History   Marital status: Divorced    Spouse name: Not on file   Number of children: Not on file   Years of education: Not on file   Highest education level: Not on file  Occupational History   Not on file  Tobacco Use   Smoking status: Never   Smokeless tobacco: Never  Vaping Use   Vaping Use: Never used  Substance and Sexual Activity   Alcohol use: Yes    Comment: social   Drug use: No   Sexual activity: Yes    Birth control/protection: Post-menopausal  Other Topics Concern   Not on file  Social History Narrative   Not on file   Social Determinants of Health   Financial Resource Strain: Not on file  Food Insecurity: Not on file  Transportation Needs: Not on file  Physical Activity: Not on file  Stress: Not on file  Social Connections: Not on file  Intimate Partner Violence: Not on file   Family Status  Relation Name Status   Father Halina Andreas   Mother  Deceased   Brother  Alive   MGM  Alive   MGF Dexter Alive   Neg Hx  (Not Specified)   Family History  Problem Relation Age of  Onset   Hypertension Father    Arthritis Father    Hyperlipidemia Father    Hypertension Mother    Arthritis Mother    Hyperlipidemia Mother    Lung cancer Mother    Arthritis Brother    Hyperlipidemia Brother    Hypertension Brother    Arthritis Maternal Grandmother    Cancer Maternal Grandfather    Colon cancer Neg Hx    Esophageal cancer Neg Hx    Rectal cancer Neg Hx    Stomach cancer Neg Hx    Allergies  Allergen Reactions   Sulfa Antibiotics Hives   Sulfasalazine Hives    Patient Care Team: Mohsen Odenthal, Charlene Brooke, NP as PCP - General (Internal Medicine) Delsa Bern, MD as Consulting Physician (Obstetrics and Gynecology) Mammography, [facility] (Diagnostic Radiology)   Medications: Outpatient Medications Prior to Visit  Medication Sig   cholecalciferol (VITAMIN D3) 25 MCG (1000 UNIT) tablet Take 1,000 Units by mouth daily.   ibuprofen  (ADVIL) 800 MG tablet Take 1 tablet (800 mg total) by mouth every 8 (eight) hours as needed. With food   Omega-3 Fatty Acids (FISH OIL PO) Take by mouth.   omeprazole (PRILOSEC) 40 MG capsule omeprazole 40 mg capsule,delayed release  TAKE 1 CAPSULE BY MOUTH ONCE DAILY   traZODone (DESYREL) 100 MG tablet Take 1 tablet (100 mg total) by mouth at bedtime as needed for sleep.   No facility-administered medications prior to visit.    Review of Systems  Constitutional:  Negative for activity change, appetite change and unexpected weight change.  Respiratory: Negative.    Cardiovascular: Negative.   Gastrointestinal: Negative.   Endocrine: Negative for cold intolerance and heat intolerance.  Genitourinary: Negative.   Musculoskeletal: Negative.   Skin: Negative.   Neurological: Negative.   Hematological: Negative.   Psychiatric/Behavioral:  Negative for behavioral problems, decreased concentration, dysphoric mood, hallucinations, self-injury, sleep disturbance and suicidal ideas. The patient is not nervous/anxious.         Objective:  BP 119/80 (BP Location: Left Arm, Patient Position: Sitting, Cuff Size: Normal)   Pulse 81   Temp 98.2 F (36.8 C) (Temporal)   Resp 16   Ht 5\' 2"  (1.575 m)   Wt 153 lb 3.2 oz (69.5 kg)   LMP 09/10/2015 Comment: Patient signed waiver  SpO2 98%   BMI 28.02 kg/m     Physical Exam Vitals and nursing note reviewed.  Constitutional:      General: She is not in acute distress. HENT:     Right Ear: Tympanic membrane, ear canal and external ear normal.     Left Ear: Tympanic membrane, ear canal and external ear normal.     Nose: Nose normal.  Eyes:     Extraocular Movements: Extraocular movements intact.     Conjunctiva/sclera: Conjunctivae normal.     Pupils: Pupils are equal, round, and reactive to light.  Neck:     Thyroid: No thyroid mass, thyromegaly or thyroid tenderness.  Cardiovascular:     Rate and Rhythm: Normal rate and regular rhythm.      Pulses: Normal pulses.     Heart sounds: Normal heart sounds.  Pulmonary:     Effort: Pulmonary effort is normal.     Breath sounds: Normal breath sounds.  Abdominal:     General: Bowel sounds are normal.     Palpations: Abdomen is soft.  Musculoskeletal:        General: Normal range of motion.     Cervical back: Normal range of  motion and neck supple.     Right lower leg: No edema.     Left lower leg: No edema.  Lymphadenopathy:     Cervical: No cervical adenopathy.  Skin:    General: Skin is warm and dry.  Neurological:     Mental Status: She is alert and oriented to person, place, and time.     Cranial Nerves: No cranial nerve deficit.  Psychiatric:        Mood and Affect: Mood normal.        Behavior: Behavior normal.        Thought Content: Thought content normal.      Results for orders placed or performed in visit on 04/06/22  Lipid panel  Result Value Ref Range   Cholesterol 228 (H) 0 - 200 mg/dL   Triglycerides 70.0 0.0 - 149.0 mg/dL   HDL 81.20 >39.00 mg/dL   VLDL 14.0 0.0 - 40.0 mg/dL   LDL Cholesterol 132 (H) 0 - 99 mg/dL   Total CHOL/HDL Ratio 3    NonHDL 146.43   Comprehensive metabolic panel  Result Value Ref Range   Sodium 140 135 - 145 mEq/L   Potassium 3.9 3.5 - 5.1 mEq/L   Chloride 101 96 - 112 mEq/L   CO2 33 (H) 19 - 32 mEq/L   Glucose, Bld 88 70 - 99 mg/dL   BUN 14 6 - 23 mg/dL   Creatinine, Ser 0.99 0.40 - 1.20 mg/dL   Total Bilirubin 0.4 0.2 - 1.2 mg/dL   Alkaline Phosphatase 90 39 - 117 U/L   AST 20 0 - 37 U/L   ALT 13 0 - 35 U/L   Total Protein 7.7 6.0 - 8.3 g/dL   Albumin 4.5 3.5 - 5.2 g/dL   GFR 63.58 >60.00 mL/min   Calcium 10.1 8.4 - 10.5 mg/dL  CBC with Differential/Platelet  Result Value Ref Range   WBC 4.7 4.0 - 10.5 K/uL   RBC 4.34 3.87 - 5.11 Mil/uL   Hemoglobin 14.9 12.0 - 15.0 g/dL   HCT 43.3 36.0 - 46.0 %   MCV 99.8 78.0 - 100.0 fl   MCHC 34.3 30.0 - 36.0 g/dL   RDW 12.3 11.5 - 15.5 %   Platelets 277.0 150.0 -  400.0 K/uL   Neutrophils Relative % 51.7 43.0 - 77.0 %   Lymphocytes Relative 36.6 12.0 - 46.0 %   Monocytes Relative 10.7 3.0 - 12.0 %   Eosinophils Relative 0.5 0.0 - 5.0 %   Basophils Relative 0.5 0.0 - 3.0 %   Neutro Abs 2.4 1.4 - 7.7 K/uL   Lymphs Abs 1.7 0.7 - 4.0 K/uL   Monocytes Absolute 0.5 0.1 - 1.0 K/uL   Eosinophils Absolute 0.0 0.0 - 0.7 K/uL   Basophils Absolute 0.0 0.0 - 0.1 K/uL      Assessment & Plan:    Routine Health Maintenance and Physical Exam  Immunization History  Administered Date(s) Administered   Influenza Inj Mdck Quad Pf 09/29/2017   Influenza-Unspecified 10/05/2018, 10/10/2019, 10/23/2020, 10/19/2021   MMR 05/31/2007   Moderna Sars-Covid-2 Vaccination 04/21/2019, 05/19/2019, 12/08/2019, 05/08/2020, 09/20/2020   Td 05/31/2007   Tdap 05/31/2007, 04/09/2020   Unspecified SARS-COV-2 Vaccination 04/05/2019, 05/05/2019, 12/05/2019   Zoster Recombinat (Shingrix) 08/16/2021, 10/19/2021   Health Maintenance  Topic Date Due   PAP SMEAR-Modifier  02/26/2022   COVID-19 Vaccine (9 - 2023-24 season) 04/22/2022 (Originally 09/04/2021)   HIV Screening  10/22/2022 (Originally 05/24/1980)   INFLUENZA VACCINE  08/05/2022   MAMMOGRAM  10/02/2023   COLONOSCOPY (Pts 45-63yrs Insurance coverage will need to be confirmed)  03/20/2028   DTaP/Tdap/Td (4 - Td or Tdap) 04/10/2030   Hepatitis C Screening  Completed   Zoster Vaccines- Shingrix  Completed   HPV VACCINES  Aged Out   Discussed health benefits of physical activity, and encouraged her to engage in regular exercise appropriate for her age and condition. Requested records from GYN  Problem List Items Addressed This Visit       Digestive   Pancreatic lesion    Repeat MRI 10/2021 indicates decrease to size: 64mm to 93mm. Mild irregularity of the wall of the lesion, subtle peripheral nodular enhancement. No communication with pancreatic duct.  She denies any GI symptoms, no weight loss Offered to entered GI  referral to [place], but she opted to f/up with Dr. Collene Mares [facility]). Faxed copy of MRI reports to Dr. Collene Mares.        Other   Pure hypercholesterolemia   Relevant Orders   Lipid panel (Completed)   Other Visit Diagnoses     Encounter for preventative adult health care exam with abnormal findings    -  Primary   Relevant Orders   Comprehensive metabolic panel (Completed)   Elevated ferritin level       Relevant Orders   Iron, TIBC and Ferritin Panel   CBC with Differential/Platelet (Completed)      Return in about 1 year (around 04/06/2023) for CPE (fasting).     Wilfred Lacy, NP

## 2022-04-06 NOTE — Progress Notes (Signed)
Abnormal: Normal cbc and cmp Improved lipid panel: continue to maintain heart healthy diet and daily exercise

## 2022-04-06 NOTE — Assessment & Plan Note (Addendum)
Repeat MRI 10/2021 indicates decrease to size: 21mm to 17mm. Mild irregularity of the wall of the lesion, subtle peripheral nodular enhancement. No communication with pancreatic duct.  She denies any GI symptoms, no weight loss Offered to entered GI referral to [place], but she opted to f/up with Dr. Collene Mares [facility]). Faxed copy of MRI reports to Dr. Collene Mares.

## 2022-04-07 LAB — IRON,TIBC AND FERRITIN PANEL
%SAT: 53 % (calc) — ABNORMAL HIGH (ref 16–45)
Ferritin: 148 ng/mL (ref 16–232)
Iron: 156 ug/dL (ref 45–160)
TIBC: 297 mcg/dL (calc) (ref 250–450)

## 2022-04-07 NOTE — Progress Notes (Signed)
Stable Follow instructions as discussed during office visit.

## 2022-04-07 NOTE — Telephone Encounter (Signed)
Pt is wanting a cb concerning her labs. Please advise pt at [phone]

## 2022-04-08 NOTE — Telephone Encounter (Signed)
Called Stephanie Cherry back and she was concerned about the elevated iron saturation.  Per Baldo Ash, she has nothing to worry about.  Her total iron levels are normal

## 2022-05-19 ENCOUNTER — Other Ambulatory Visit: Payer: Self-pay | Admitting: Gastroenterology

## 2022-05-20 ENCOUNTER — Telehealth: Payer: Self-pay | Admitting: Nurse Practitioner

## 2022-05-20 NOTE — Telephone Encounter (Signed)
Prescription Request  05/20/2022  LOV: 04/06/2022  What is the name of the medication or equipment? omeprazole (PRILOSEC) 40 MG capsule [[id number]]   Have you contacted your pharmacy to request a refill? No   Which pharmacy would you like this sent to? [facility] Address: [address] [place] [address] Phone: [phone]     Patient notified that their request is being sent to the clinical staff for review and that they should receive a response within 2 business days.   Please advise at Mobile [phone] (mobile)

## 2022-05-21 ENCOUNTER — Other Ambulatory Visit: Payer: Self-pay

## 2022-05-21 ENCOUNTER — Encounter: Payer: Self-pay | Admitting: Nurse Practitioner

## 2022-05-21 MED ORDER — OMEPRAZOLE 40 MG PO CPDR: DELAYED_RELEASE_CAPSULE | ORAL | 0 refills | Status: DC

## 2022-06-02 ENCOUNTER — Encounter: Payer: Self-pay | Admitting: Nurse Practitioner

## 2022-08-16 ENCOUNTER — Other Ambulatory Visit: Payer: Self-pay | Admitting: Nurse Practitioner

## 2022-08-27 ENCOUNTER — Encounter (HOSPITAL_COMMUNITY): Payer: Self-pay | Admitting: Gastroenterology

## 2022-08-27 NOTE — Progress Notes (Signed)
Attempted to obtain medical history via telephone, unable to reach at this time. HIPAA compliant voicemail message left requesting return call to pre surgical testing department. 

## 2022-09-02 NOTE — Anesthesia Preprocedure Evaluation (Addendum)
Anesthesia Evaluation  Patient identified by MRN, date of birth, ID band Patient awake    Reviewed: Allergy & Precautions, NPO status , Patient's Chart, lab work & pertinent test results  History of Anesthesia Complications Negative for: history of anesthetic complications  Airway Mallampati: II  TM Distance: >3 FB Neck ROM: Full    Dental no notable dental hx.    Pulmonary neg pulmonary ROS   Pulmonary exam normal        Cardiovascular negative cardio ROS Normal cardiovascular exam     Neuro/Psych negative neurological ROS     GI/Hepatic Neg liver ROS,GERD  Medicated,,Pancreatic cyst   Endo/Other  negative endocrine ROS    Renal/GU negative Renal ROS     Musculoskeletal negative musculoskeletal ROS (+)    Abdominal   Peds  Hematology negative hematology ROS (+)   Anesthesia Other Findings Day of surgery medications reviewed with patient.  Reproductive/Obstetrics                              Anesthesia Physical Anesthesia Plan  ASA: 2  Anesthesia Plan: MAC   Post-op Pain Management: Minimal or no pain anticipated   Induction:   PONV Risk Score and Plan: 2 and Treatment may vary due to age or medical condition and Propofol infusion  Airway Management Planned: Natural Airway and Nasal Cannula  Additional Equipment: None  Intra-op Plan:   Post-operative Plan:   Informed Consent: I have reviewed the patients History and Physical, chart, labs and discussed the procedure including the risks, benefits and alternatives for the proposed anesthesia with the patient or authorized representative who has indicated his/her understanding and acceptance.       Plan Discussed with: CRNA  Anesthesia Plan Comments:         Anesthesia Quick Evaluation

## 2022-09-03 ENCOUNTER — Encounter (HOSPITAL_COMMUNITY)
Admission: RE | Disposition: A | Discharge status: Home / Self Care | Payer: Self-pay | Source: Home / Self Care | Attending: Gastroenterology

## 2022-09-03 ENCOUNTER — Ambulatory Visit (HOSPITAL_COMMUNITY): Payer: Federal, State, Local not specified - PPO | Admitting: Anesthesiology

## 2022-09-03 ENCOUNTER — Other Ambulatory Visit: Payer: Self-pay

## 2022-09-03 ENCOUNTER — Ambulatory Visit (HOSPITAL_COMMUNITY)
Admission: RE | Admit: 2022-09-03 | Discharge: 2022-09-03 | Disposition: A | Discharge status: Home / Self Care | Payer: Federal, State, Local not specified - PPO | Attending: Gastroenterology | Admitting: Gastroenterology

## 2022-09-03 DIAGNOSIS — K862 Cyst of pancreas: Secondary | ICD-10-CM | POA: Diagnosis present

## 2022-09-03 HISTORY — PX: ESOPHAGOGASTRODUODENOSCOPY: SHX5428

## 2022-09-03 HISTORY — PX: UPPER ESOPHAGEAL ENDOSCOPIC ULTRASOUND (EUS): SHX6562

## 2022-09-03 SURGERY — UPPER ESOPHAGEAL ENDOSCOPIC ULTRASOUND (EUS)
Anesthesia: Monitor Anesthesia Care

## 2022-09-03 MED ORDER — PROPOFOL 500 MG/50ML IV EMUL
INTRAVENOUS | Status: DC | PRN
  Administered 2022-09-03: 100 ug/kg/min via INTRAVENOUS

## 2022-09-03 MED ORDER — LACTATED RINGERS IV SOLN
INTRAVENOUS | Status: DC
  Administered 2022-09-03: 1000 mL via INTRAVENOUS

## 2022-09-03 MED ORDER — SODIUM CHLORIDE 0.9 % IV SOLN: INTRAVENOUS | Status: DC

## 2022-09-03 MED ORDER — LACTATED RINGERS IV SOLN: INTRAVENOUS | Status: DC | PRN

## 2022-09-03 MED ORDER — PROPOFOL 10 MG/ML IV BOLUS
INTRAVENOUS | Status: DC | PRN
Start: 2022-09-03 — End: 2022-09-03
  Administered 2022-09-03 (×2): 40 mg via INTRAVENOUS

## 2022-09-03 MED ORDER — DEXMEDETOMIDINE HCL IN NACL 80 MCG/20ML IV SOLN
INTRAVENOUS | Status: DC | PRN
  Administered 2022-09-03: 8 ug via INTRAVENOUS

## 2022-09-03 MED ORDER — LIDOCAINE 2% (20 MG/ML) 5 ML SYRINGE
INTRAMUSCULAR | Status: DC | PRN
  Administered 2022-09-03: 100 mg via INTRAVENOUS

## 2022-09-03 NOTE — Discharge Instructions (Signed)

## 2022-09-03 NOTE — Transfer of Care (Signed)
Immediate Anesthesia Transfer of Care Note  Patient: Stephanie Cherry  Procedure(s) Performed: UPPER ESOPHAGEAL ENDOSCOPIC ULTRASOUND (EUS) ESOPHAGOGASTRODUODENOSCOPY (EGD)  Patient Location: PACU  Anesthesia Type:MAC  Level of Consciousness: awake and alert   Airway & Oxygen Therapy: Patient Spontanous Breathing and Patient connected to nasal cannula oxygen  Post-op Assessment: Report given to RN and Post -op Vital signs reviewed and stable  Post vital signs: Reviewed and stable  Last Vitals:  Vitals Value Taken Time  BP 115/50 09/03/22 0909  Temp    Pulse 66 09/03/22 0909  Resp 15 09/03/22 0909  SpO2 100 % 09/03/22 0909  Vitals shown include unfiled device data.  Last Pain:  Vitals:   09/03/22 0737  TempSrc: Temporal  PainSc: 0-No pain         Complications: No notable events documented.

## 2022-09-03 NOTE — Anesthesia Postprocedure Evaluation (Signed)
Anesthesia Post Note  Patient: Stephanie Cherry  Procedure(s) Performed: UPPER ESOPHAGEAL ENDOSCOPIC ULTRASOUND (EUS) ESOPHAGOGASTRODUODENOSCOPY (EGD)     Patient location during evaluation: PACU Anesthesia Type: MAC Level of consciousness: awake and alert Pain management: pain level controlled Vital Signs Assessment: post-procedure vital signs reviewed and stable Respiratory status: spontaneous breathing, nonlabored ventilation and respiratory function stable Cardiovascular status: blood pressure returned to baseline Postop Assessment: no apparent nausea or vomiting Anesthetic complications: no   No notable events documented.  Last Vitals:  Vitals:   09/03/22 0930 09/03/22 0934  BP: 107/71   Pulse: 64 65  Resp: 14 15  Temp:    SpO2: 93% 95%    Last Pain:  Vitals:   09/03/22 0925  TempSrc:   PainSc: 6                  Shanda Howells

## 2022-09-03 NOTE — H&P (Signed)
Donivan Scull HPI: This 57 year old black female presents to the office for an abnormal scan. She had an MRI of the abdomen with and without contrast done 10/26/2021 that revealed a 9 mm cystic lesion of the pancreatic tail. There was mild irregularity of the wall of the lesion and subtle peripheral nodular enhancement. There is no communication with the pancreatic duct. She has 1 BM per day with no obvious blood or mucus in the stool. She has a good appetite and her weight has been stable. She denies having any complaints of abdominal pain, nausea, vomiting, acid reflux, dysphagia or odynophagia. She denies having a family history of colon cancer, celiac sprue or IBD. Her last EGD and colonoscopy done on 03/27/2018 were both normal.  Past Medical History:  Diagnosis Date   Bacterial infection    GERD (gastroesophageal reflux disease)    H/O: menorrhagia 07/21/2010   Libido, decreased 07/11/2010   Yeast infection     Past Surgical History:  Procedure Laterality Date   COLONOSCOPY  03/27/2018   Dr Loreta Ave   ESOPHAGOGASTRODUODENOSCOPY  03/27/2018   Dr Loreta Ave    Family History  Problem Relation Age of Onset   Hypertension Father    Arthritis Father    Hyperlipidemia Father    Hypertension Mother    Arthritis Mother    Hyperlipidemia Mother    Lung cancer Mother    Arthritis Brother    Hyperlipidemia Brother    Hypertension Brother    Arthritis Maternal Grandmother    Cancer Maternal Grandfather    Colon cancer Neg Hx    Esophageal cancer Neg Hx    Rectal cancer Neg Hx    Stomach cancer Neg Hx     Social History:  reports that she has never smoked. She has never used smokeless tobacco. She reports current alcohol use. She reports that she does not use drugs.  Allergies:  Allergies  Allergen Reactions   Sulfa Antibiotics Hives   Sulfasalazine Hives    Medications: Scheduled: Continuous:  sodium chloride     lactated ringers Stopped (09/03/22 0927)    No results found for  this or any previous visit (from the past 24 hour(s)).   No results found.  ROS:  As stated above in the HPI otherwise negative.  Blood pressure 107/71, pulse 65, temperature (!) 97.2 F (36.2 C), temperature source Temporal, resp. rate 15, height 5\' 3"  (1.6 m), weight 68 kg, last menstrual period 09/10/2015, SpO2 95%.    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  Sowmya Partridge D 09/03/2022, 11:08 AM

## 2022-09-03 NOTE — Op Note (Signed)
[facility] Patient Name: Stephanie Cherry Procedure Date: 09/03/2022 MRN: [id number] Attending MD: Jeani Hawking , MD, [id number] Date of Birth: 02-10-1965 CSN: [id number] Age: 57 Admit Type: Outpatient Procedure:                Upper EUS Indications:              Pancreatic cyst on MRI Providers:                Jeani Hawking, MD, Jacquelyn "Jaci" Clelia Croft, RN, Marge Duncans, RN, Harrington Challenger, Technician Referring MD:              Medicines:                Propofol per Anesthesia Complications:            No immediate complications. Estimated Blood Loss:     Estimated blood loss: none. Procedure:                Pre-Anesthesia Assessment:                           - Prior to the procedure, a History and Physical                            was performed, and patient medications and                            allergies were reviewed. The patient's tolerance of                            previous anesthesia was also reviewed. The risks                            and benefits of the procedure and the sedation                            options and risks were discussed with the patient.                            All questions were answered, and informed consent                            was obtained. Prior Anticoagulants: The patient has                            taken no anticoagulant or antiplatelet agents. ASA                            Grade Assessment: II - A patient with mild systemic                            disease. After reviewing the risks and benefits,  the patient was deemed in satisfactory condition to                            undergo the procedure.                           - Sedation was administered by an anesthesia                            professional. Deep sedation was attained.                           After obtaining informed consent, the endoscope was                            passed under direct  vision. Throughout the                            procedure, the patient's blood pressure, pulse, and                            oxygen saturations were monitored continuously. The                            Linear GF- [id number] ( [id number] ) was introduced                            through the mouth, and advanced to the duodenal                            bulb. The upper EUS was accomplished without                            difficulty. The patient tolerated the procedure                            well. Scope In: Scope Out: Findings:      ENDOSCOPIC FINDING: :      The examined esophagus was endoscopically normal.      The entire examined stomach was endoscopically normal.      The examined duodenum was endoscopically normal.      ENDOSONOGRAPHIC FINDING: :      A multicystic and septated lesion suggestive of a cyst was identified in       the pancreatic body. It is not in obvious communication with the       pancreatic duct. The lesion measured 10 mm by 6 mm in maximal       cross-sectional diameter. There were many compartments thickly septated.       The outer wall of the lesion was thin. There was no associated mass.       There was no internal debris within the fluid-filled cavity.      In the distal body of the pancreas a small multicystic lesion was       identified. This did not appear to be a serous cystadenoma nor did it       appear  to be a typical side-branch IPMN. There was no significant cystic       compartment to sample. There was no obvious communication with the PD. Impression:               - Normal esophagus.                           - Normal stomach.                           - Normal examined duodenum.                           - A cystic lesion was seen in the pancreatic body.                           - No specimens collected. Moderate Sedation:      Not Applicable - Patient had care per Anesthesia. Recommendation:           - Patient has a contact number  available for                            emergencies. The signs and symptoms of potential                            delayed complications were discussed with the                            patient. Return to normal activities tomorrow.                            Written discharge instructions were provided to the                            patient.                           - Resume regular diet.                           - Repeat MRI in one year.                           - Follow up with Dr. Loreta Ave as needed. Procedure Code(s):        --- Professional ---                           [phone], Esophagogastroduodenoscopy, flexible,                            transoral; with endoscopic ultrasound examination                            limited to the esophagus, stomach or duodenum, and                            adjacent  structures Diagnosis Code(s):        --- Professional ---                           K86.2, Cyst of pancreas CPT copyright 2022 American Medical Association. All rights reserved. The codes documented in this report are preliminary and upon coder review may  be revised to meet current compliance requirements. Jeani Hawking, MD Jeani Hawking, MD 09/03/2022 9:11:57 AM This report has been signed electronically. Number of Addenda: 0

## 2022-09-06 ENCOUNTER — Encounter (HOSPITAL_COMMUNITY): Payer: Self-pay | Admitting: Gastroenterology

## 2022-09-08 NOTE — Telephone Encounter (Signed)
done

## 2022-09-30 ENCOUNTER — Other Ambulatory Visit: Payer: Self-pay | Admitting: Nurse Practitioner

## 2022-09-30 DIAGNOSIS — F5101 Primary insomnia: Secondary | ICD-10-CM

## 2022-12-09 DIAGNOSIS — L219 Seborrheic dermatitis, unspecified: Secondary | ICD-10-CM | POA: Insufficient documentation

## 2023-04-13 ENCOUNTER — Encounter: Payer: Self-pay | Admitting: Nurse Practitioner

## 2023-04-13 ENCOUNTER — Ambulatory Visit (INDEPENDENT_AMBULATORY_CARE_PROVIDER_SITE_OTHER): Admitting: Nurse Practitioner

## 2023-04-13 VITALS — BP 136/88 | HR 87 | Temp 98.3°F | Ht 63.0 in | Wt 152.0 lb

## 2023-04-13 DIAGNOSIS — E7800 Pure hypercholesterolemia, unspecified: Secondary | ICD-10-CM | POA: Diagnosis not present

## 2023-04-13 DIAGNOSIS — K862 Cyst of pancreas: Secondary | ICD-10-CM

## 2023-04-13 DIAGNOSIS — R7989 Other specified abnormal findings of blood chemistry: Secondary | ICD-10-CM | POA: Diagnosis not present

## 2023-04-13 DIAGNOSIS — Z0001 Encounter for general adult medical examination with abnormal findings: Secondary | ICD-10-CM | POA: Diagnosis not present

## 2023-04-13 LAB — LIPID PANEL
Cholesterol: 240 mg/dL — ABNORMAL HIGH (ref 0–200)
HDL: 85.8 mg/dL (ref >39.00)
LDL Cholesterol: 140 mg/dL — ABNORMAL HIGH (ref 0–99)
NonHDL: 154.35
Total CHOL/HDL Ratio: 3
Triglycerides: 71 mg/dL (ref 0.0–149.0)
VLDL: 14.2 mg/dL (ref 0.0–40.0)

## 2023-04-13 LAB — COMPREHENSIVE METABOLIC PANEL WITH GFR
ALT: 11 U/L (ref 0–35)
AST: 18 U/L (ref 0–37)
Albumin: 4.7 g/dL (ref 3.5–5.2)
Alkaline Phosphatase: 95 U/L (ref 39–117)
BUN: 12 mg/dL (ref 6–23)
CO2: 33 meq/L — ABNORMAL HIGH (ref 19–32)
Calcium: 10.1 mg/dL (ref 8.4–10.5)
Chloride: 101 meq/L (ref 96–112)
Creatinine, Ser: 1 mg/dL (ref 0.40–1.20)
GFR: 62.37 mL/min (ref >60.00)
Glucose, Bld: 88 mg/dL (ref 70–99)
Potassium: 4.4 meq/L (ref 3.5–5.1)
Sodium: 141 meq/L (ref 135–145)
Total Bilirubin: 0.5 mg/dL (ref 0.2–1.2)
Total Protein: 7.8 g/dL (ref 6.0–8.3)

## 2023-04-13 LAB — CBC
HCT: 44 % (ref 36.0–46.0)
Hemoglobin: 14.7 g/dL (ref 12.0–15.0)
MCHC: 33.5 g/dL (ref 30.0–36.0)
MCV: 100.8 fl — ABNORMAL HIGH (ref 78.0–100.0)
Platelets: 276 10*3/uL (ref 150.0–400.0)
RBC: 4.37 Mil/uL (ref 3.87–5.11)
RDW: 12.3 % (ref 11.5–15.5)
WBC: 5.2 10*3/uL (ref 4.0–10.5)

## 2023-04-13 NOTE — Assessment & Plan Note (Signed)
 Denies use of any oral supplement. Repeat CBC and iron panel

## 2023-04-13 NOTE — Patient Instructions (Addendum)
 Go to lab Maintain Heart healthy diet and daily exercise. Maintain current medications. Need to repeat MRI ABDOMEN. Call office with imaging center you want order faxed to.  Preventive Care 58-58 Years Old, Female Preventive care refers to lifestyle choices and visits with your health care provider that can promote health and wellness. Preventive care visits are also called wellness exams. What can I expect for my preventive care visit? Counseling Your health care provider may ask you questions about your: Medical history, including: Past medical problems. Family medical history. Pregnancy history. Current health, including: Menstrual cycle. Method of birth control. Emotional well-being. Home life and relationship well-being. Sexual activity and sexual health. Lifestyle, including: Alcohol, nicotine or tobacco, and drug use. Access to firearms. Diet, exercise, and sleep habits. Work and work Astronomer. Sunscreen use. Safety issues such as seatbelt and bike helmet use. Physical exam Your health care provider will check your: Height and weight. These may be used to calculate your BMI (body mass index). BMI is a measurement that tells if you are at a healthy weight. Waist circumference. This measures the distance around your waistline. This measurement also tells if you are at a healthy weight and may help predict your risk of certain diseases, such as type 2 diabetes and high blood pressure. Heart rate and blood pressure. Body temperature. Skin for abnormal spots. What immunizations do I need?  Vaccines are usually given at various ages, according to a schedule. Your health care provider will recommend vaccines for you based on your age, medical history, and lifestyle or other factors, such as travel or where you work. What tests do I need? Screening Your health care provider may recommend screening tests for certain conditions. This may include: Lipid and cholesterol  levels. Diabetes screening. This is done by checking your blood sugar (glucose) after you have not eaten for a while (fasting). Pelvic exam and Pap test. Hepatitis B test. Hepatitis C test. HIV (human immunodeficiency virus) test. STI (sexually transmitted infection) testing, if you are at risk. Lung cancer screening. Colorectal cancer screening. Mammogram. Talk with your health care provider about when you should start having regular mammograms. This may depend on whether you have a family history of breast cancer. BRCA-related cancer screening. This may be done if you have a family history of breast, ovarian, tubal, or peritoneal cancers. Bone density scan. This is done to screen for osteoporosis. Talk with your health care provider about your test results, treatment options, and if necessary, the need for more tests. Follow these instructions at home: Eating and drinking  Eat a diet that includes fresh fruits and vegetables, whole grains, lean protein, and low-fat dairy products. Take vitamin and mineral supplements as recommended by your health care provider. Do not drink alcohol if: Your health care provider tells you not to drink. You are pregnant, may be pregnant, or are planning to become pregnant. If you drink alcohol: Limit how much you have to 0-1 drink a day. Know how much alcohol is in your drink. In the [place] one drink equals one 12 oz bottle of beer (355 mL), one 5 oz glass of wine (148 mL), or one 1 oz glass of hard liquor (44 mL). Lifestyle Brush your teeth every morning and night with fluoride toothpaste. Floss one time each day. Exercise for at least 30 minutes 5 or more days each week. Do not use any products that contain nicotine or tobacco. These products include cigarettes, chewing tobacco, and vaping devices, such as e-cigarettes. If you  need help quitting, ask your health care provider. Do not use drugs. If you are sexually active, practice safe sex. Use a  condom or other form of protection to prevent STIs. If you do not wish to become pregnant, use a form of birth control. If you plan to become pregnant, see your health care provider for a prepregnancy visit. Take aspirin only as told by your health care provider. Make sure that you understand how much to take and what form to take. Work with your health care provider to find out whether it is safe and beneficial for you to take aspirin daily. Find healthy ways to manage stress, such as: Meditation, yoga, or listening to music. Journaling. Talking to a trusted person. Spending time with friends and family. Minimize exposure to UV radiation to reduce your risk of skin cancer. Safety Always wear your seat belt while driving or riding in a vehicle. Do not drive: If you have been drinking alcohol. Do not ride with someone who has been drinking. When you are tired or distracted. While texting. If you have been using any mind-altering substances or drugs. Wear a helmet and other protective equipment during sports activities. If you have firearms in your house, make sure you follow all gun safety procedures. Seek help if you have been physically or sexually abused. What's next? Visit your health care provider once a year for an annual wellness visit. Ask your health care provider how often you should have your eyes and teeth checked. Stay up to date on all vaccines. This information is not intended to replace advice given to you by your health care provider. Make sure you discuss any questions you have with your health care provider. Document Revised: 06/18/2020 Document Reviewed: 06/18/2020 Elsevier Patient Education  2024 [employer].

## 2023-04-13 NOTE — Progress Notes (Addendum)
 Complete physical exam  Patient: Stephanie Cherry   DOB: March 01, 1965   58 y.o. Female  MRN: [id number] Visit Date: 04/19/2023  Subjective:    Chief Complaint  Patient presents with   Annual Exam    Fasting    Stephanie Cherry is a 58 y.o. female who presents today for a complete physical exam. She reports consuming a general diet.  Exercise 3x/week-cardio and weight training  She generally feels well. She reports sleeping well. She does have additional problems to discuss today.  Vision:No Dental:Yes STD Screen:No  PAP smear report requested from Dr. Duke Gibbons.  BP Readings from Last 3 Encounters:  04/13/23 136/88  09/03/22 107/71  04/06/22 119/80   Wt Readings from Last 3 Encounters:  04/13/23 152 lb (68.9 kg)  09/03/22 150 lb (68 kg)  04/06/22 153 lb 3.2 oz (69.5 kg)   Most recent fall risk assessment:    04/13/2023    1:05 PM  Fall Risk   Falls in the past year? 0  Number falls in past yr: 0  Injury with Fall? 0  Risk for fall due to : No Fall Risks  Follow up Falls prevention discussed   Depression screen:Yes - No Depression Most recent depression screenings:    04/13/2023    1:05 PM 04/06/2022    8:41 AM  PHQ 2/9 Scores  PHQ - 2 Score 0 0  PHQ- 9 Score 0    HPI  Pancreatic cyst Upper endoscopy completed 08/2022: no biopsy obtained. Normal stomach and esophagus.   Advised [name] about need for repeat MRI ABDOMEN. She wants to talking to her insurance about imaging center within her network, before order is placed. She will call office when she has name and fax of imaging center.  Pure hypercholesterolemia Repeat lipid panel: elevated LDL and TC Maintain mediterranean diet  Elevated ferritin level Denies use of any oral supplement. Repeat CBC and iron panel  Past Medical History:  Diagnosis Date   Bacterial infection    GERD (gastroesophageal reflux disease)    H/O: menorrhagia 07/21/2010   Libido, decreased 07/11/2010   Yeast infection    Past  Surgical History:  Procedure Laterality Date   COLONOSCOPY  03/27/2018   Dr Tova Fresh   ESOPHAGOGASTRODUODENOSCOPY  03/27/2018   Dr Tova Fresh   ESOPHAGOGASTRODUODENOSCOPY N/A 09/03/2022   Procedure: ESOPHAGOGASTRODUODENOSCOPY (EGD);  Surgeon: Alvis Jourdain, MD;  Location: [facility];  Service: Gastroenterology;  Laterality: N/A;   UPPER ESOPHAGEAL ENDOSCOPIC ULTRASOUND (EUS) N/A 09/03/2022   Procedure: UPPER ESOPHAGEAL ENDOSCOPIC ULTRASOUND (EUS);  Surgeon: Alvis Jourdain, MD;  Location: [facility];  Service: Gastroenterology;  Laterality: N/A;   Social History   Socioeconomic History   Marital status: Divorced    Spouse name: Not on file   Number of children: Not on file   Years of education: Not on file   Highest education level: Associate degree: occupational, Scientist, product/process development, or vocational program  Occupational History   Not on file  Tobacco Use   Smoking status: Never   Smokeless tobacco: Never  Vaping Use   Vaping status: Never Used  Substance and Sexual Activity   Alcohol use: Yes    Comment: social   Drug use: No   Sexual activity: Yes    Birth control/protection: Post-menopausal  Other Topics Concern   Not on file  Social History Narrative   Not on file   Social Drivers of Health   Financial Resource Strain: Patient Declined (04/12/2023)   Overall Financial Resource Strain (CARDIA)  Difficulty of Paying Living Expenses: Patient declined  Food Insecurity: Patient Declined (04/12/2023)   Hunger Vital Sign    Worried About Running Out of Food in the Last Year: Patient declined    Ran Out of Food in the Last Year: Patient declined  Transportation Needs: No Transportation Needs (04/12/2023)   PRAPARE - Administrator, Civil Service (Medical): No    Lack of Transportation (Non-Medical): No  Physical Activity: Insufficiently Active (04/12/2023)   Exercise Vital Sign    Days of Exercise per Week: 3 days    Minutes of Exercise per Session: 30 min  Stress: No Stress Concern  Present (04/12/2023)   [employer] of Occupational Health - Occupational Stress Questionnaire    Feeling of Stress : Not at all  Social Connections: Unknown (04/12/2023)   Social Connection and Isolation Panel [NHANES]    Frequency of Communication with Friends and Family: More than three times a week    Frequency of Social Gatherings with Friends and Family: Three times a week    Attends Religious Services: Patient declined    Active Member of Clubs or Organizations: Patient declined    Attends Banker Meetings: Not on file    Marital Status: Patient declined  Intimate Partner Violence: Not on file   Family Status  Relation Name Status   Father Daylene Evangelist   Mother  Deceased   Brother  Alive   MGM  Alive   MGF Dexter Alive   Neg Hx  (Not Specified)  No partnership data on file   Family History  Problem Relation Age of Onset   Hypertension Father    Arthritis Father    Hyperlipidemia Father    Hypertension Mother    Arthritis Mother    Hyperlipidemia Mother    Lung cancer Mother    Arthritis Brother    Hyperlipidemia Brother    Hypertension Brother    Arthritis Maternal Grandmother    Cancer Maternal Grandfather    Colon cancer Neg Hx    Esophageal cancer Neg Hx    Rectal cancer Neg Hx    Stomach cancer Neg Hx    Allergies  Allergen Reactions   Sulfa Antibiotics Hives   Sulfasalazine Hives    Patient Care Team: Mazell Aylesworth, Connye Delaine, NP as PCP - General (Internal Medicine) Ona Bidding, MD as Consulting Physician (Obstetrics and Gynecology) Mammography, [facility] (Diagnostic Radiology)   Medications: Outpatient Medications Prior to Visit  Medication Sig   cholecalciferol (VITAMIN D3) 25 MCG (1000 UNIT) tablet Take 1,000 Units by mouth daily.   ibuprofen (ADVIL) 800 MG tablet Take 1 tablet (800 mg total) by mouth every 8 (eight) hours as needed. With food   omeprazole (PRILOSEC) 40 MG capsule TAKE 1 CAPSULE BY MOUTH DAILY. (Patient taking  differently: as needed. TAKE 1 CAPSULE BY MOUTH DAILY.)   traZODone (DESYREL) 100 MG tablet TAKE 1 TABLET AT BEDTIME AS NEEDED FOR SLEEP   [DISCONTINUED] Omega-3 Fatty Acids (FISH OIL PO) Take by mouth.   No facility-administered medications prior to visit.   Review of Systems  Constitutional:  Negative for activity change, appetite change and unexpected weight change.  Respiratory: Negative.    Cardiovascular: Negative.   Gastrointestinal: Negative.   Endocrine: Negative for cold intolerance and heat intolerance.  Genitourinary: Negative.   Musculoskeletal: Negative.   Skin: Negative.   Neurological: Negative.   Hematological: Negative.   Psychiatric/Behavioral:  Negative for behavioral problems, decreased concentration, dysphoric mood, hallucinations, self-injury, sleep disturbance and  suicidal ideas. The patient is not nervous/anxious.        Objective:  BP 136/88   Pulse 87   Temp 98.3 F (36.8 C) (Temporal)   Ht 5\' 3"  (1.6 m)   Wt 152 lb (68.9 kg)   LMP 09/10/2015 Comment: Patient signed waiver  SpO2 97%   BMI 26.93 kg/m     Physical Exam Vitals and nursing note reviewed.  Constitutional:      General: She is not in acute distress. HENT:     Right Ear: Tympanic membrane, ear canal and external ear normal.     Left Ear: Tympanic membrane, ear canal and external ear normal.     Nose: Nose normal.  Eyes:     Extraocular Movements: Extraocular movements intact.     Conjunctiva/sclera: Conjunctivae normal.     Pupils: Pupils are equal, round, and reactive to light.  Neck:     Thyroid: No thyroid mass, thyromegaly or thyroid tenderness.  Cardiovascular:     Rate and Rhythm: Normal rate and regular rhythm.     Pulses: Normal pulses.     Heart sounds: Normal heart sounds.  Pulmonary:     Effort: Pulmonary effort is normal.     Breath sounds: Normal breath sounds.  Abdominal:     General: Bowel sounds are normal.     Palpations: Abdomen is soft.  Musculoskeletal:         General: Normal range of motion.     Cervical back: Normal range of motion and neck supple.     Right lower leg: No edema.     Left lower leg: No edema.  Lymphadenopathy:     Cervical: No cervical adenopathy.  Skin:    General: Skin is warm and dry.  Neurological:     Mental Status: She is alert and oriented to person, place, and time.     Cranial Nerves: No cranial nerve deficit.  Psychiatric:        Mood and Affect: Mood normal.        Behavior: Behavior normal.        Thought Content: Thought content normal.      Results for orders placed or performed in visit on 04/13/23  Lipid panel  Result Value Ref Range   Cholesterol 240 (H) 0 - 200 mg/dL   Triglycerides 95.6 0.0 - 149.0 mg/dL   HDL 21.30 >86.57 mg/dL   VLDL 84.6 0.0 - 96.2 mg/dL   LDL Cholesterol 952 (H) 0 - 99 mg/dL   Total CHOL/HDL Ratio 3    NonHDL 154.35   CBC  Result Value Ref Range   WBC 5.2 4.0 - 10.5 K/uL   RBC 4.37 3.87 - 5.11 Mil/uL   Platelets 276.0 150.0 - 400.0 K/uL   Hemoglobin 14.7 12.0 - 15.0 g/dL   HCT 84.1 32.4 - 40.1 %   MCV 100.8 (H) 78.0 - 100.0 fl   MCHC 33.5 30.0 - 36.0 g/dL   RDW 02.7 25.3 - 66.4 %  Iron, TIBC and Ferritin Panel  Result Value Ref Range   Iron 130 45 - 160 mcg/dL   TIBC 403 474 - 259 mcg/dL (calc)   %SAT 45 16 - 45 % (calc)   Ferritin 183 16 - 232 ng/mL  Comprehensive metabolic panel with GFR  Result Value Ref Range   Sodium 141 135 - 145 mEq/L   Potassium 4.4 3.5 - 5.1 mEq/L   Chloride 101 96 - 112 mEq/L   CO2 33 (H) 19 -  32 mEq/L   Glucose, Bld 88 70 - 99 mg/dL   BUN 12 6 - 23 mg/dL   Creatinine, Ser 2.44 0.40 - 1.20 mg/dL   Total Bilirubin 0.5 0.2 - 1.2 mg/dL   Alkaline Phosphatase 95 39 - 117 U/L   AST 18 0 - 37 U/L   ALT 11 0 - 35 U/L   Total Protein 7.8 6.0 - 8.3 g/dL   Albumin 4.7 3.5 - 5.2 g/dL   GFR 01.02 >72.53 mL/min   Calcium 10.1 8.4 - 10.5 mg/dL      Assessment & Plan:    Routine Health Maintenance and Physical Exam  Immunization  History  Administered Date(s) Administered   Influenza Inj Mdck Quad Pf 09/29/2017   Influenza, Mdck, Trivalent,PF 6+ MOS(egg free) 10/10/2022   Influenza-Unspecified 10/05/2018, 10/10/2019, 10/23/2020, 10/19/2021   MMR 05/31/2007   Moderna Covid-19 Fall Seasonal Vaccine 60yrs & older 10/10/2022   Moderna Sars-Covid-2 Vaccination 04/21/2019, 05/19/2019, 12/08/2019, 05/08/2020, 09/20/2020   Td 05/31/2007   Tdap 05/31/2007, 04/09/2020   Unspecified SARS-COV-2 Vaccination 04/05/2019, 05/05/2019, 12/05/2019   Zoster Recombinant(Shingrix) 08/16/2021, 10/19/2021   Health Maintenance  Topic Date Due   Cervical Cancer Screening (HPV/Pap Cotest)  Never done   HIV Screening  04/12/2024 (Originally [birth year]/24/1980)   INFLUENZA VACCINE  08/05/2023   MAMMOGRAM  10/02/2023   Colonoscopy  03/20/2028   DTaP/Tdap/Td (4 - Td or Tdap) 04/10/2030   COVID-19 Vaccine  Completed   Hepatitis C Screening  Completed   Zoster Vaccines- Shingrix  Completed   HPV VACCINES  Aged Out   Meningococcal B Vaccine  Aged Out   Discussed health benefits of physical activity, and encouraged her to engage in regular exercise appropriate for her age and condition.  Problem List Items Addressed This Visit     Elevated ferritin level   Denies use of any oral supplement. Repeat CBC and iron panel      Relevant Orders   CBC (Completed)   Iron, TIBC and Ferritin Panel (Completed)   Pancreatic cyst   Upper endoscopy completed 08/2022: no biopsy obtained. Normal stomach and esophagus.   Advised Mrs. Doke about need for repeat MRI ABDOMEN. She wants to talking to her insurance about imaging center within her network, before order is placed. She will call office when she has name and fax of imaging center.      Pure hypercholesterolemia   Repeat lipid panel: elevated LDL and TC Maintain mediterranean diet      Relevant Orders   Lipid panel (Completed)   Other Visit Diagnoses       Encounter for preventative adult  health care exam with abnormal findings    -  Primary   Relevant Orders   Comprehensive metabolic panel with GFR (Completed)      Return in about 1 year (around 04/12/2024) for CPE (fasting).     Kathrene Parents, NP

## 2023-04-13 NOTE — Assessment & Plan Note (Addendum)
 Upper endoscopy completed 08/2022: no biopsy obtained. Normal stomach and esophagus.   Advised Mrs. Rinkenberger about need for repeat MRI ABDOMEN. She wants to talking to her insurance about imaging center within her network, before order is placed. She will call office when she has name and fax of imaging center.

## 2023-04-13 NOTE — Assessment & Plan Note (Addendum)
 Repeat lipid panel: elevated LDL and TC Maintain mediterranean diet

## 2023-04-14 ENCOUNTER — Encounter: Payer: Self-pay | Admitting: Nurse Practitioner

## 2023-04-14 LAB — IRON,TIBC AND FERRITIN PANEL
%SAT: 45 % (ref 16–45)
Ferritin: 183 ng/mL (ref 16–232)
Iron: 130 ug/dL (ref 45–160)
TIBC: 291 ug/dL (ref 250–450)

## 2023-04-15 ENCOUNTER — Telehealth: Payer: Self-pay

## 2023-04-15 NOTE — Telephone Encounter (Signed)
 Lab results were sent to Pt through MyChart by Alysia Penna on 04/14/2023; Pt read message.

## 2023-04-15 NOTE — Telephone Encounter (Signed)
 Copied from CRM [id number]. Topic: Clinical - Lab/Test Results >> Apr 14, 2023 11:44 AM Saverio Danker wrote: Reason for CRM: Patient is calling in to request someone contact her to go over her recent lab results

## 2023-04-19 ENCOUNTER — Telehealth: Payer: Self-pay | Admitting: Nurse Practitioner

## 2023-04-19 DIAGNOSIS — E7800 Pure hypercholesterolemia, unspecified: Secondary | ICD-10-CM

## 2023-04-19 NOTE — Telephone Encounter (Signed)
 Correction made in chart. We dicussed CBC,CMP, iron panel and lipid panel results. I sent information on mediterranean diet via mychart.

## 2023-04-19 NOTE — Telephone Encounter (Signed)
-----   Message from [facility] S sent at 04/19/2023  1:48 PM EDT ----- Regarding: Pt requesting call back from provider Pt called in office with concerns from her last visit and labs. Pt. Requesting phone call from Kathrene Parents, NP regarding:  Incorrect documentation:  # Pt states her note states she is on a low fat low sodium diet. Pt states she is not on. # Note that she has a fatty Liver from a MRI. (Pt states is she has a fatty liver no one ever told her and would like to discuss.  Wants to discuss:  Pt states a Mediterranean diet was not provided or discuss and would like the information on the diet.  Also requesting to go over labs that she has questions about.   Explain to pt that a office visit may be needed if she has many concerns but I would route all of her concerns to Kathrene Parents, NP.

## 2023-06-17 ENCOUNTER — Other Ambulatory Visit: Payer: Self-pay

## 2023-06-17 DIAGNOSIS — F5101 Primary insomnia: Secondary | ICD-10-CM

## 2023-06-17 MED ORDER — TRAZODONE HCL 100 MG PO TABS
100.0000 mg | ORAL_TABLET | Freq: Every evening | ORAL | 0 refills | Status: DC | PRN
Start: 2023-06-17 — End: 2023-10-11

## 2023-06-17 NOTE — Telephone Encounter (Signed)
 Received fax from patient's pharmacy requesting refills on Trazodone  100 mg.  Medication: Trazodone  (Desyrel ) 100 mg  Directions: Take 1 tablet by  mouth at bedtime as needed for sleep  Last given: 09/30/22 Number refills: 2 Last o/v: 04/13/23 Follow up: 1 year  Labs: 04/13/23

## 2023-08-22 ENCOUNTER — Other Ambulatory Visit (INDEPENDENT_AMBULATORY_CARE_PROVIDER_SITE_OTHER)

## 2023-08-22 DIAGNOSIS — E7800 Pure hypercholesterolemia, unspecified: Secondary | ICD-10-CM

## 2023-08-22 LAB — LIPID PANEL
Cholesterol: 217 mg/dL — ABNORMAL HIGH (ref 0–200)
HDL: 81.2 mg/dL (ref >39.00)
LDL Cholesterol: 125 mg/dL — ABNORMAL HIGH (ref 0–99)
NonHDL: 135.8
Total CHOL/HDL Ratio: 3
Triglycerides: 52 mg/dL (ref 0.0–149.0)
VLDL: 10.4 mg/dL (ref 0.0–40.0)

## 2023-08-24 ENCOUNTER — Ambulatory Visit: Payer: Self-pay | Admitting: Nurse Practitioner

## 2023-10-07 IMAGING — MR MR ABDOMEN WO/W CM
13 of 22 series · 26 of 48 positions shown · IV contrast (14 ml multihance)
Comparison: CT abdomen and pelvis 04/21/2021

CLINICAL DATA: Pancreas lesion follow-up

EXAM:
MRI ABDOMEN WITHOUT AND WITH CONTRAST
TECHNIQUE: Multiplanar multisequence MR imaging of the abdomen was performed
both before and after the administration of intravenous contrast.
CONTRAST:  14mL MULTIHANCE GADOBENATE DIMEGLUMINE 529 MG/ML IV SOLN

[Series 3: cor haste · coronal · 5.0mm · 0.74mm/px · 1 of 32 slices shown]
[im 1/32]
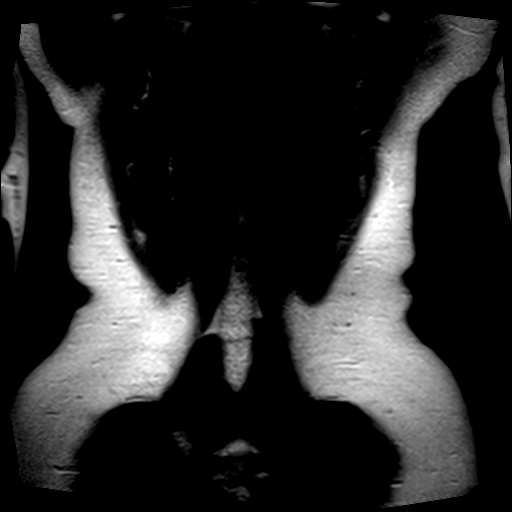

[Series 4: axial haste · axial · 6.0mm · 0.78mm/px · 1 of 30 slices shown]
[im 1/30]
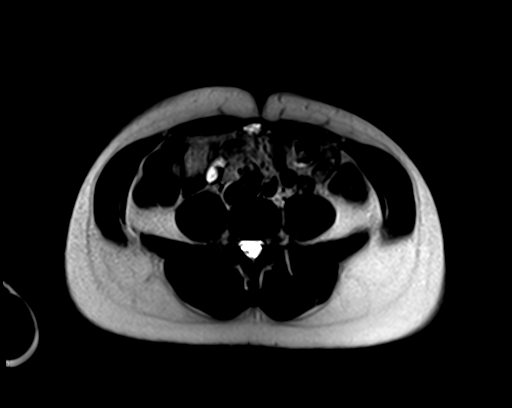

[Series 9: ep2d_[id number]_trig · axial · 6.0mm · 2.03mm/px · z∈[-47,+183]mm · 3 of 99 slices shown]
[im 1/99]
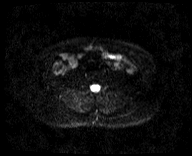
[im 50/99]
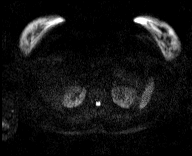
[im 99/99]
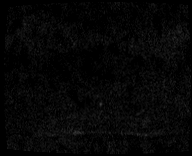

[Series 10: [id number]_trig_adc · axial · 6.0mm · 2.03mm/px · 1 of 33 slices shown]
[im 1/33]
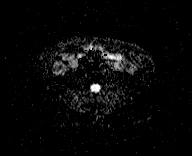

[Series 11: bSSFP · axial · 4.0mm · 0.72mm/px · z∈[-78,+162]mm · 2 of 61 slices shown (1 of 2)]
[im 1/61]
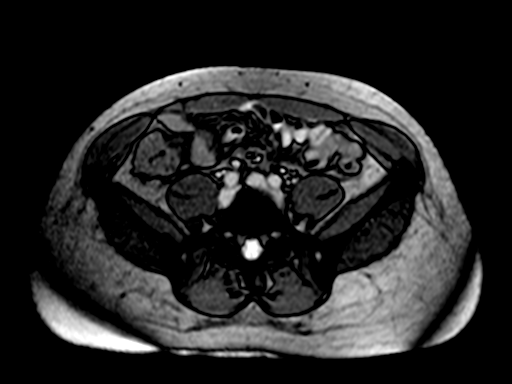
[im 61/61]
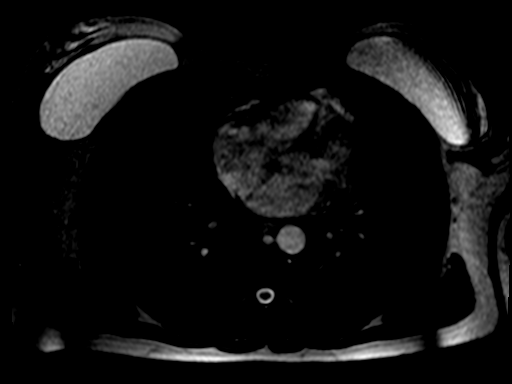

[Series 12: bSSFP · coronal · 5.0mm · 0.78mm/px · 1 of 31 slices shown (2 of 2)]
[im 1/31]
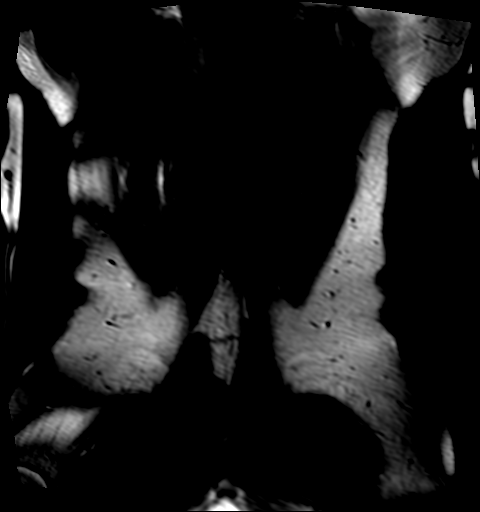

[Series 15: T2 · coronal · 3.0mm · 0.70mm/px · 2 of 55 slices shown]
[im 1/55]
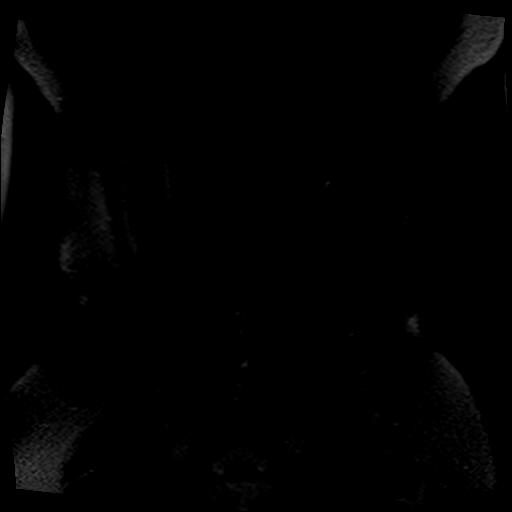
[im 55/55]
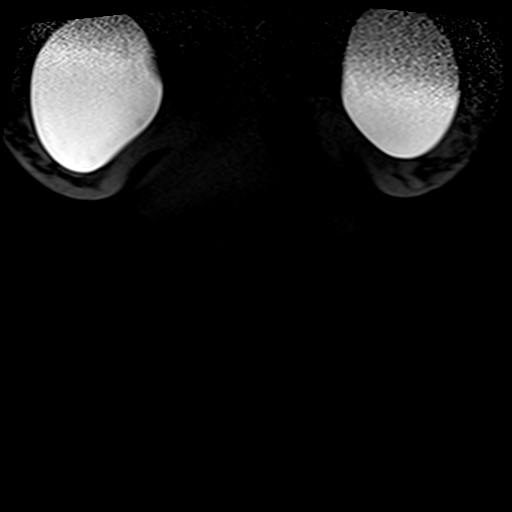

[Series 17: T2 fat-sat · axial · 6.0mm · 1.09mm/px · 1 of 32 slices shown]
[im 1/32]
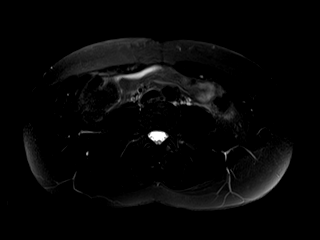

[Series 18: T1 · axial · 6.0mm · 0.74mm/px · z∈[-73,+158]mm · 2 of 72 slices shown]
[im 1/72]
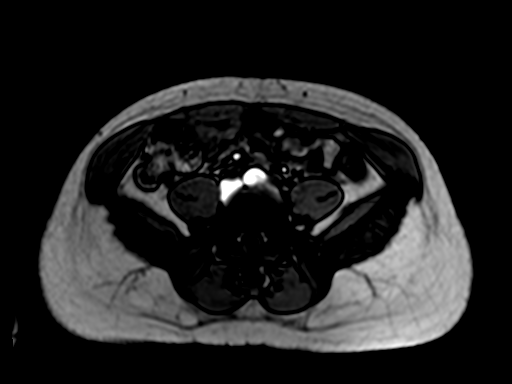
[im 72/72]
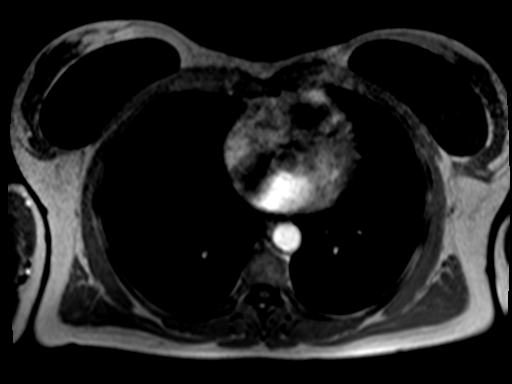

[Series 19: T1 dynamic · axial · non-contrast · 2.5mm · 0.74mm/px · z∈[-66,+151]mm · 3 of 88 slices shown]
[im 1/88]
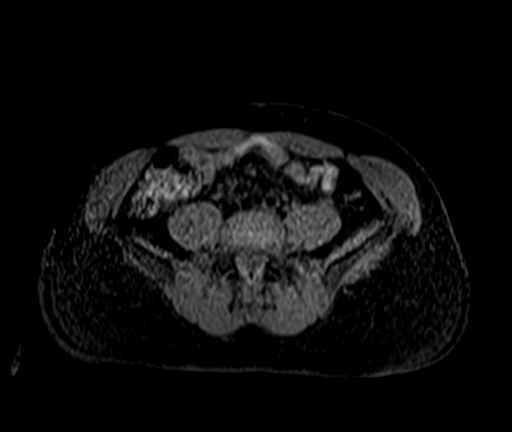
[im 44/88]
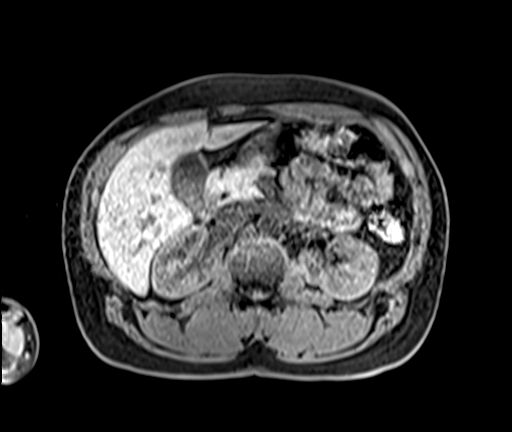
[im 88/88]
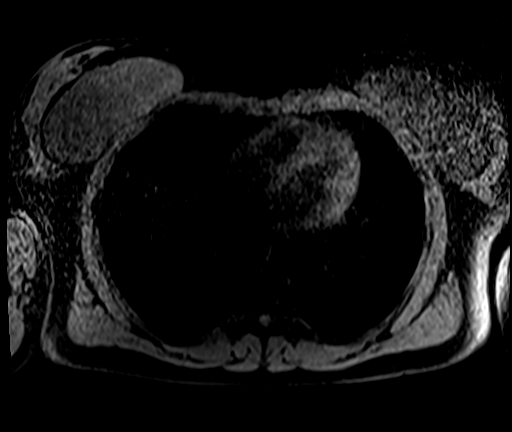

[Series 20: T1 dynamic post-contrast · axial · 2.5mm · 0.74mm/px · z∈[-66,+151]mm · 3 of 88 slices shown (1 of 3)]
[im 1/88]
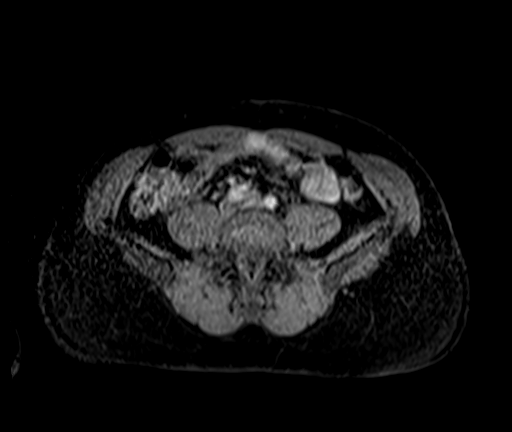
[im 44/88]
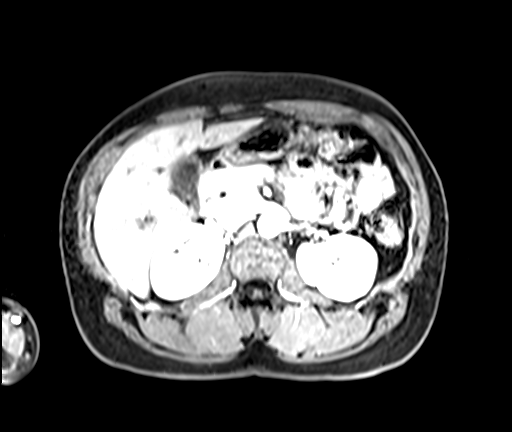
[im 88/88]
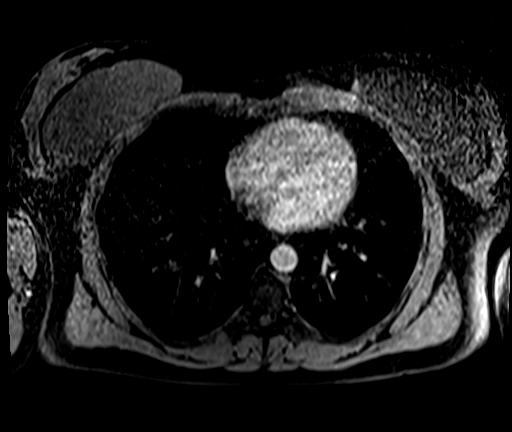

[Series 21: T1 dynamic post-contrast · axial · 2.5mm · 0.74mm/px · z∈[-66,+151]mm · 3 of 88 slices shown (2 of 3)]
[im 1/88]
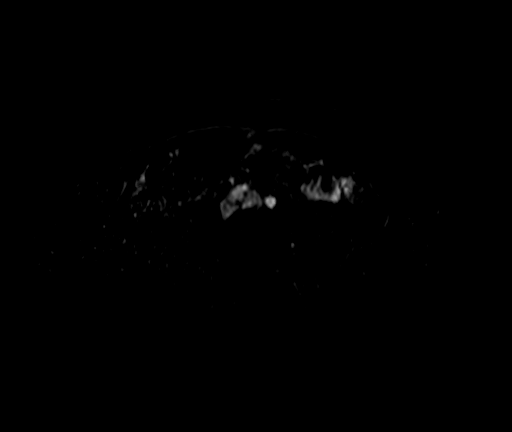
[im 44/88]
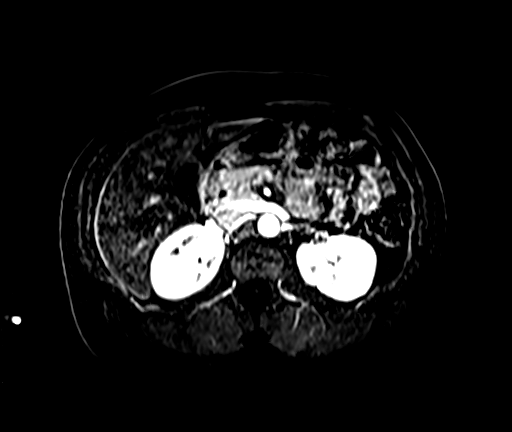
[im 88/88]
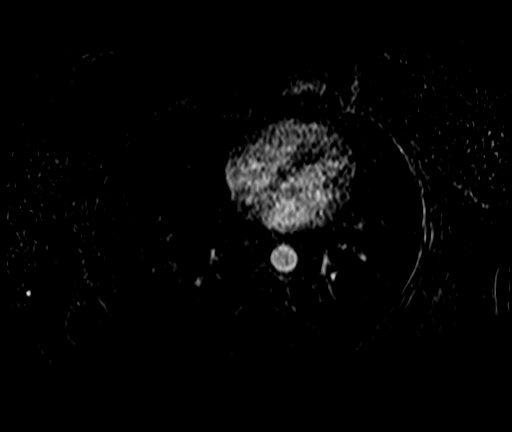

[Series 22: T1 dynamic post-contrast · axial · 2.5mm · 0.74mm/px · z∈[-66,+151]mm · 3 of 88 slices shown (3 of 3)]
[im 1/88]
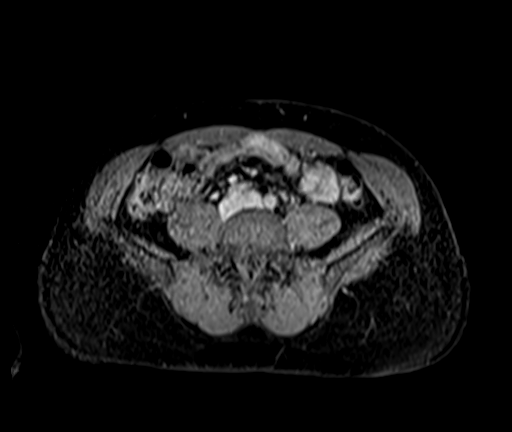
[im 44/88]
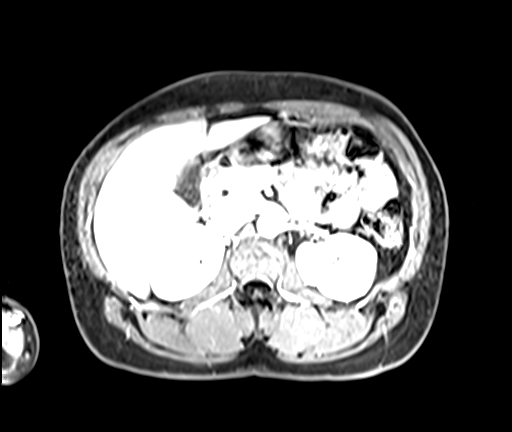
[im 88/88]
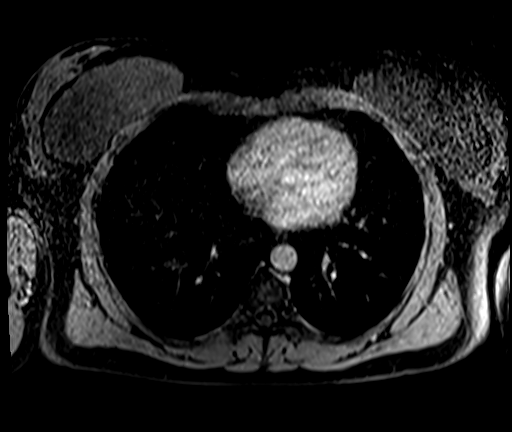

[26 of 48 positions shown; findings below may reference images not displayed]

FINDINGS: Lower chest: No acute findings.

Hepatobiliary: Liver is normal in size and contour with no
suspicious mass identified. Gallbladder appears grossly normal. No
biliary ductal dilatation identified.

Pancreas: 11 mm somewhat irregular shaped hyperintense T2 signal
cystic lesion in the tail the pancreas which appears to communicate
with the pancreatic duct on the T2 coronal sequence. No suspicious
mural enhancement appreciated. Pancreatic duct is normal caliber.

Spleen:  Within normal limits in size and appearance.

Adrenals/Urinary Tract: No masses identified. No evidence of
hydronephrosis.

Stomach/Bowel: Visualized portions within the abdomen are
unremarkable.

Vascular/Lymphatic: No pathologically enlarged lymph nodes
identified. No abdominal aortic aneurysm demonstrated.

Other:  No ascites.

Musculoskeletal: No suspicious bone lesions identified.
IMPRESSION: 11 mm cystic lesion in the tail the pancreas as described suggesting
side-branch IPMN. Follow-up MRI with contrast recommended in 1 year.

## 2023-10-09 ENCOUNTER — Other Ambulatory Visit: Payer: Self-pay | Admitting: Nurse Practitioner

## 2023-10-09 DIAGNOSIS — F5101 Primary insomnia: Secondary | ICD-10-CM

## 2023-10-13 LAB — HM MAMMOGRAPHY

## 2023-10-17 ENCOUNTER — Encounter: Payer: Self-pay | Admitting: Nurse Practitioner

## 2023-11-01 ENCOUNTER — Ambulatory Visit (INDEPENDENT_AMBULATORY_CARE_PROVIDER_SITE_OTHER): Payer: Self-pay | Admitting: Podiatry

## 2023-11-01 ENCOUNTER — Telehealth: Payer: Self-pay | Admitting: Lab

## 2023-11-01 ENCOUNTER — Ambulatory Visit (INDEPENDENT_AMBULATORY_CARE_PROVIDER_SITE_OTHER)

## 2023-11-01 DIAGNOSIS — M21611 Bunion of right foot: Secondary | ICD-10-CM

## 2023-11-01 DIAGNOSIS — M21619 Bunion of unspecified foot: Secondary | ICD-10-CM

## 2023-11-01 NOTE — Telephone Encounter (Signed)
 Patient calling states she forget insert information would like a call with information.

## 2023-11-01 NOTE — Progress Notes (Signed)
 "    Chief Complaint  Patient presents with   Bunions    Right foot bunion pain for aprox 10 years that is progressively getting worse.  Not diabetic and no anti coag.    Discussed the use of AI scribe software for clinical note transcription with the patient, who gave verbal consent to proceed.  History of Present Illness Stephanie Cherry is a 58 year old female who presents with worsening right bunion pain.  She has experienced right bunion pain for approximately ten years, which began around the time she started working from home. Initially, the bunion was noticeable but not problematic. Over time, the condition has worsened, with the bunion becoming more prominent and inflamed, occasionally appearing puffy.  No history of gout or injuries to the bunion area. Soreness is present when pressure is applied directly over the bunion, but there is no radiating pain, tingling, or electric shock sensations. She reports that she sometimes cannot bend her toe all the way downward.  She currently uses a gel cover over the bunion, which provides some relief, and wears flats and an old insole for support. The gel cover helps reduce inflammation, but the insole is quite old. She wears a shoe size of eight to eight and a half and reports redness on her foot after wearing certain shoes, despite them being comfortable and stretchy.  Family history reveals that her grandmother had bunions, suggesting a hereditary component, although her mother did not have bunions.     Past Medical History:  Diagnosis Date   Bacterial infection    GERD (gastroesophageal reflux disease)    H/O: menorrhagia 07/21/2010   Libido, decreased 07/11/2010   Yeast infection    Past Surgical History:  Procedure Laterality Date   COLONOSCOPY  03/27/2018   Dr Kristie   ESOPHAGOGASTRODUODENOSCOPY  03/27/2018   Dr Kristie   ESOPHAGOGASTRODUODENOSCOPY N/A 09/03/2022   Procedure: ESOPHAGOGASTRODUODENOSCOPY (EGD);  Surgeon: Rollin Dover, MD;  Location: [facility];  Service: Gastroenterology;  Laterality: N/A;   UPPER ESOPHAGEAL ENDOSCOPIC ULTRASOUND (EUS) N/A 09/03/2022   Procedure: UPPER ESOPHAGEAL ENDOSCOPIC ULTRASOUND (EUS);  Surgeon: Rollin Dover, MD;  Location: [facility];  Service: Gastroenterology;  Laterality: N/A;   Allergies  Allergen Reactions   Sulfa Antibiotics Hives   Sulfasalazine Hives    Physical Exam There is a bony prominence on the medial aspect of the first metatarsal head on the right foot with minimal lateral angulation of the hallux.  Mild tenderness over right bunion and sesamoids on palpation. No pain with motion of right foot. Flexible foot type.  Palpable pedal pulses noted bilateral.  No edema appreciated.  No open lesions.  Manual muscle testing 5/5 bilateral.  Epicritic sensation intact.  Minimal decreased first MPJ range of motion without crepitus right foot  Radiological exam (right foot, 3 weightbearing views, 11/01/2023): First intermetatarsal angle is approximately 8 degrees.  Tibial sesamoid position is 4.  Joint spaces within normal limits.  No fracture noted    Results     Assessment/Plan of Care: 1. 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 Associated with bursitis, causing inflammation and pain. No history of gout or injury to the bunion area. Examination reveals mild bunion with sesamoid misalignment, but good joint space and cartilage condition. Conservative management is preferred at this stage. - Continue wearing gel covers and insoles for support. - Consider cortisone injection for  symptomatic relief if bursitis flares up. - Use Voltaren gel for anti-inflammatory effect. - Try toe spacers during the day and bunion splints at night to prevent further drift. - Avoid pointed-toe shoes to reduce pressure on the bunion. - Discussed surgical options  if conservative measures fail ([place] bunionectomy), including mild correction surgery with a four to six week recovery period. Minimal incision surgery is not recommended due to the mild nature of the bunion.  Follow-up as needed.    Stephanie Cherry, DPM, FACFAS [facility]     [address] [place], [place] [address]                Office [phone]  Fax [phone]  "

## 2023-12-07 ENCOUNTER — Other Ambulatory Visit: Payer: Self-pay | Admitting: Gastroenterology

## 2023-12-07 ENCOUNTER — Encounter: Payer: Self-pay | Admitting: Gastroenterology

## 2023-12-07 DIAGNOSIS — K862 Cyst of pancreas: Secondary | ICD-10-CM

## 2024-01-15 ENCOUNTER — Other Ambulatory Visit: Payer: Self-pay | Admitting: Nurse Practitioner

## 2024-01-15 DIAGNOSIS — F5101 Primary insomnia: Secondary | ICD-10-CM

## 2024-01-17 ENCOUNTER — Encounter: Payer: Self-pay | Admitting: Gastroenterology

## 2024-01-24 ENCOUNTER — Ambulatory Visit
Admission: RE | Admit: 2024-01-24 | Discharge: 2024-01-24 | Disposition: A | Source: Ambulatory Visit | Attending: Gastroenterology | Admitting: Gastroenterology

## 2024-01-24 DIAGNOSIS — K862 Cyst of pancreas: Secondary | ICD-10-CM

## 2024-01-24 MED ORDER — GADOPICLENOL 0.5 MMOL/ML IV SOLN
7.0000 mL | Freq: Once | INTRAVENOUS | Status: AC | PRN
  Administered 2024-01-24: 7 mL via INTRAVENOUS
